# Patient Record
Sex: Female | Born: 1965 | Race: White | Hispanic: No | Marital: Married | Smoking: Former smoker
Health system: Southern US, Community
[De-identification: ages and names within clinical notes are randomized; demographics above are authoritative.]

## PROBLEM LIST (undated history)

## (undated) DIAGNOSIS — R0602 Shortness of breath: Secondary | ICD-10-CM

## (undated) DIAGNOSIS — I10 Essential (primary) hypertension: Secondary | ICD-10-CM

## (undated) DIAGNOSIS — F419 Anxiety disorder, unspecified: Secondary | ICD-10-CM

## (undated) DIAGNOSIS — E7800 Pure hypercholesterolemia, unspecified: Secondary | ICD-10-CM

## (undated) DIAGNOSIS — F32A Depression, unspecified: Secondary | ICD-10-CM

## (undated) DIAGNOSIS — M2550 Pain in unspecified joint: Secondary | ICD-10-CM

## (undated) DIAGNOSIS — M7989 Other specified soft tissue disorders: Secondary | ICD-10-CM

## (undated) DIAGNOSIS — T7840XA Allergy, unspecified, initial encounter: Secondary | ICD-10-CM

## (undated) HISTORY — DX: Shortness of breath: R06.02

## (undated) HISTORY — DX: Other specified soft tissue disorders: M79.89

## (undated) HISTORY — DX: Anxiety disorder, unspecified: F41.9

## (undated) HISTORY — DX: Pain in unspecified joint: M25.50

## (undated) HISTORY — DX: Depression, unspecified: F32.A

## (undated) HISTORY — DX: Essential (primary) hypertension: I10

## (undated) HISTORY — DX: Allergy, unspecified, initial encounter: T78.40XA

## (undated) HISTORY — PX: TUBAL LIGATION: SHX77

## (undated) HISTORY — DX: Pure hypercholesterolemia, unspecified: E78.00

---

## 2002-04-16 ENCOUNTER — Encounter: Admission: RE | Admit: 2002-04-16 | Discharge: 2002-04-16 | Payer: Self-pay | Admitting: Family Medicine

## 2002-04-16 ENCOUNTER — Encounter: Payer: Self-pay | Admitting: Family Medicine

## 2006-04-17 ENCOUNTER — Other Ambulatory Visit: Admission: RE | Admit: 2006-04-17 | Discharge: 2006-04-17 | Payer: Self-pay | Admitting: Family Medicine

## 2006-05-03 ENCOUNTER — Encounter: Admission: RE | Admit: 2006-05-03 | Discharge: 2006-05-03 | Payer: Self-pay | Admitting: Family Medicine

## 2006-05-22 ENCOUNTER — Encounter: Admission: RE | Admit: 2006-05-22 | Discharge: 2006-05-22 | Payer: Self-pay | Admitting: Family Medicine

## 2006-10-02 ENCOUNTER — Encounter: Admission: RE | Admit: 2006-10-02 | Discharge: 2006-10-02 | Payer: Self-pay | Admitting: Family Medicine

## 2007-12-15 ENCOUNTER — Other Ambulatory Visit: Admission: RE | Admit: 2007-12-15 | Discharge: 2007-12-15 | Payer: Self-pay | Admitting: Family Medicine

## 2010-05-23 ENCOUNTER — Emergency Department (HOSPITAL_COMMUNITY): Admission: EM | Admit: 2010-05-23 | Discharge: 2010-05-23 | Payer: Self-pay | Admitting: Emergency Medicine

## 2010-11-02 LAB — CBC
HCT: 42.9 % (ref 36.0–46.0)
Hemoglobin: 14.8 g/dL (ref 12.0–15.0)
MCH: 30.5 pg (ref 26.0–34.0)
MCHC: 34.5 g/dL (ref 30.0–36.0)
MCV: 88.3 fL (ref 78.0–100.0)
Platelets: 343 10*3/uL (ref 150–400)
RBC: 4.86 MIL/uL (ref 3.87–5.11)
RDW: 12.2 % (ref 11.5–15.5)
WBC: 7.6 10*3/uL (ref 4.0–10.5)

## 2010-11-02 LAB — URINE MICROSCOPIC-ADD ON

## 2010-11-02 LAB — URINALYSIS, ROUTINE W REFLEX MICROSCOPIC
Bilirubin Urine: NEGATIVE
Ketones, ur: NEGATIVE mg/dL
Nitrite: NEGATIVE
Urobilinogen, UA: 0.2 mg/dL (ref 0.0–1.0)

## 2010-11-02 LAB — COMPREHENSIVE METABOLIC PANEL
ALT: 15 U/L (ref 0–35)
AST: 16 U/L (ref 0–37)
Albumin: 4.3 g/dL (ref 3.5–5.2)
Alkaline Phosphatase: 35 U/L — ABNORMAL LOW (ref 39–117)
BUN: 17 mg/dL (ref 6–23)
CO2: 24 mEq/L (ref 19–32)
Calcium: 9.3 mg/dL (ref 8.4–10.5)
Chloride: 103 mEq/L (ref 96–112)
Creatinine, Ser: 0.77 mg/dL (ref 0.4–1.2)
GFR calc Af Amer: >60 mL/min (ref >60)
GFR calc non Af Amer: >60 mL/min (ref >60)
Glucose, Bld: 100 mg/dL — ABNORMAL HIGH (ref 70–99)
Potassium: 3.7 mEq/L (ref 3.5–5.1)
Sodium: 136 mEq/L (ref 135–145)
Total Bilirubin: 0.6 mg/dL (ref 0.3–1.2)
Total Protein: 7.5 g/dL (ref 6.0–8.3)

## 2010-11-02 LAB — DIFFERENTIAL
Basophils Absolute: 0 10*3/uL (ref 0.0–0.1)
Eosinophils Absolute: 0.3 10*3/uL (ref 0.0–0.7)
Eosinophils Relative: 3 % (ref 0–5)

## 2010-11-02 LAB — PREGNANCY, URINE: Preg Test, Ur: NEGATIVE

## 2010-11-02 LAB — LIPASE, BLOOD: Lipase: 24 U/L (ref 11–59)

## 2011-01-24 ENCOUNTER — Other Ambulatory Visit (HOSPITAL_COMMUNITY)
Admission: RE | Admit: 2011-01-24 | Discharge: 2011-01-24 | Disposition: A | Discharge status: Home / Self Care | Payer: Federal, State, Local not specified - PPO | Source: Ambulatory Visit | Attending: Family Medicine | Admitting: Family Medicine

## 2011-01-24 ENCOUNTER — Other Ambulatory Visit: Payer: Self-pay | Admitting: Family Medicine

## 2011-01-24 DIAGNOSIS — Z124 Encounter for screening for malignant neoplasm of cervix: Secondary | ICD-10-CM | POA: Insufficient documentation

## 2013-05-01 ENCOUNTER — Other Ambulatory Visit: Payer: Self-pay | Admitting: Family Medicine

## 2013-05-01 DIAGNOSIS — N63 Unspecified lump in unspecified breast: Secondary | ICD-10-CM

## 2013-05-12 ENCOUNTER — Ambulatory Visit
Admission: RE | Admit: 2013-05-12 | Discharge: 2013-05-12 | Disposition: A | Discharge status: Home / Self Care | Payer: Federal, State, Local not specified - PPO | Source: Ambulatory Visit | Attending: Family Medicine | Admitting: Family Medicine

## 2013-05-12 ENCOUNTER — Other Ambulatory Visit: Payer: Self-pay | Admitting: Family Medicine

## 2013-05-12 DIAGNOSIS — N63 Unspecified lump in unspecified breast: Secondary | ICD-10-CM

## 2014-03-24 ENCOUNTER — Ambulatory Visit (INDEPENDENT_AMBULATORY_CARE_PROVIDER_SITE_OTHER): Payer: Federal, State, Local not specified - PPO | Admitting: Family Medicine

## 2014-03-24 VITALS — BP 126/74 | HR 84 | Temp 98.8°F | Resp 17 | Ht 62.0 in | Wt 172.0 lb

## 2014-03-24 DIAGNOSIS — R059 Cough, unspecified: Secondary | ICD-10-CM

## 2014-03-24 DIAGNOSIS — J0100 Acute maxillary sinusitis, unspecified: Secondary | ICD-10-CM

## 2014-03-24 DIAGNOSIS — R05 Cough: Secondary | ICD-10-CM

## 2014-03-24 MED ORDER — AMOXICILLIN 875 MG PO TABS: 875.0000 mg | ORAL_TABLET | Freq: Two times a day (BID) | ORAL | Status: DC

## 2014-03-24 MED ORDER — HYDROCODONE-HOMATROPINE 5-1.5 MG/5ML PO SYRP: 5.0000 mL | ORAL_SOLUTION | ORAL | Status: DC | PRN

## 2014-03-24 MED ORDER — FLUTICASONE PROPIONATE 50 MCG/ACT NA SUSP: NASAL | Status: DC

## 2014-03-24 NOTE — Progress Notes (Signed)
Subjective: Patient has been having problems with respiratory congestion for 3 or 4 days. She says her grandson had been sick 10 days ago. She had fever initially. She has pain in her sinuses and pressure there. Her nose is congested. Throat has been sore. Ears do not hurt. She is coughing a lot, especially when she lays down. She has been able to keep working.  Objective: Pleasant alert lady in no major distress but she is obviously congested. Her TMs are normal. Throat was clear. Tender over her sinuses, especially the left maxillary and frontal. Neck supple without significant nodes. Chest clear. Heart regular without murmurs.  Assessment: URI with sinusitis and cough  Plan: Fluticasone nose spray Hycodan cough syrup Amoxicillin 875 twice daily Return if problems

## 2014-03-24 NOTE — Patient Instructions (Signed)
Drink plenty of fluids  Use the nose spray 2 sprays each nostril twice daily for 3 or 4 days then decrease to once daily  Take the amoxicillin one twice daily  Use the cough syrup 1 teaspoon every 4-6 hours as needed. It will tend to make you drowsy so primarily use it at nighttime  Return if worse

## 2014-10-22 ENCOUNTER — Other Ambulatory Visit (HOSPITAL_COMMUNITY)
Admission: RE | Admit: 2014-10-22 | Discharge: 2014-10-22 | Disposition: A | Discharge status: Home / Self Care | Payer: Federal, State, Local not specified - PPO | Source: Ambulatory Visit | Attending: Family Medicine | Admitting: Family Medicine

## 2014-10-22 ENCOUNTER — Other Ambulatory Visit: Payer: Self-pay | Admitting: Family Medicine

## 2014-10-22 DIAGNOSIS — Z124 Encounter for screening for malignant neoplasm of cervix: Secondary | ICD-10-CM | POA: Diagnosis not present

## 2014-10-26 LAB — CYTOLOGY - PAP

## 2016-01-09 ENCOUNTER — Other Ambulatory Visit: Payer: Self-pay

## 2016-01-09 ENCOUNTER — Other Ambulatory Visit (HOSPITAL_BASED_OUTPATIENT_CLINIC_OR_DEPARTMENT_OTHER): Payer: Self-pay

## 2016-01-09 DIAGNOSIS — F39 Unspecified mood [affective] disorder: Secondary | ICD-10-CM | POA: Diagnosis not present

## 2016-01-09 DIAGNOSIS — Z0000 Encounter for general adult medical examination without abnormal findings: Secondary | ICD-10-CM | POA: Diagnosis not present

## 2016-01-09 DIAGNOSIS — M545 Low back pain: Secondary | ICD-10-CM | POA: Diagnosis not present

## 2016-01-09 DIAGNOSIS — Z1231 Encounter for screening mammogram for malignant neoplasm of breast: Secondary | ICD-10-CM

## 2016-01-09 DIAGNOSIS — E7800 Pure hypercholesterolemia, unspecified: Secondary | ICD-10-CM | POA: Diagnosis not present

## 2016-01-24 ENCOUNTER — Other Ambulatory Visit: Payer: Self-pay | Admitting: Family Medicine

## 2016-01-24 ENCOUNTER — Ambulatory Visit
Admission: RE | Admit: 2016-01-24 | Discharge: 2016-01-24 | Disposition: A | Discharge status: Home / Self Care | Payer: Federal, State, Local not specified - PPO | Source: Ambulatory Visit

## 2016-01-24 DIAGNOSIS — Z1231 Encounter for screening mammogram for malignant neoplasm of breast: Secondary | ICD-10-CM

## 2016-03-05 ENCOUNTER — Other Ambulatory Visit: Payer: Self-pay | Admitting: Gastroenterology

## 2016-03-05 DIAGNOSIS — D122 Benign neoplasm of ascending colon: Secondary | ICD-10-CM | POA: Diagnosis not present

## 2016-03-05 DIAGNOSIS — K5730 Diverticulosis of large intestine without perforation or abscess without bleeding: Secondary | ICD-10-CM | POA: Diagnosis not present

## 2016-03-05 DIAGNOSIS — Z1211 Encounter for screening for malignant neoplasm of colon: Secondary | ICD-10-CM | POA: Diagnosis not present

## 2016-03-05 DIAGNOSIS — D126 Benign neoplasm of colon, unspecified: Secondary | ICD-10-CM | POA: Diagnosis not present

## 2016-09-04 DIAGNOSIS — R635 Abnormal weight gain: Secondary | ICD-10-CM | POA: Diagnosis not present

## 2016-09-04 DIAGNOSIS — M7710 Lateral epicondylitis, unspecified elbow: Secondary | ICD-10-CM | POA: Diagnosis not present

## 2016-09-14 ENCOUNTER — Encounter (INDEPENDENT_AMBULATORY_CARE_PROVIDER_SITE_OTHER): Payer: Self-pay | Admitting: Orthopedic Surgery

## 2016-09-14 ENCOUNTER — Ambulatory Visit (INDEPENDENT_AMBULATORY_CARE_PROVIDER_SITE_OTHER): Payer: Federal, State, Local not specified - PPO | Admitting: Orthopedic Surgery

## 2016-09-14 ENCOUNTER — Ambulatory Visit (INDEPENDENT_AMBULATORY_CARE_PROVIDER_SITE_OTHER): Payer: Self-pay

## 2016-09-14 DIAGNOSIS — M7711 Lateral epicondylitis, right elbow: Secondary | ICD-10-CM | POA: Diagnosis not present

## 2016-09-14 MED ORDER — DICLOFENAC SODIUM 2 % TD SOLN: 1.0000 | Freq: Two times a day (BID) | TRANSDERMAL | 1 refills | Status: DC

## 2016-09-14 NOTE — Progress Notes (Signed)
Office Visit Note   Patient: Lauren Murray           Date of Birth: 10/20/1965           MRN: [id number] Visit Date: 09/14/2016 Requested by: Maurice SmallElaine Griffin, MD [address] [place], [place] [address] PCP: No PCP Per Patient  Subjective: Chief Complaint  Patient presents with  . Right Elbow - Pain    HPI Lauren Murray is a 51 year old right-hand-dominant patient with elbow pain.  Been going on for several months.  Gripping and picking up things is painful.  She reports some burning at the olecranon and lateral condyle region.  Does run down to her arm.  She tried Aleve with no relief.  Really no comfortable place and it aches all the time.  She has tried a tennis elbow strap.  She works at the [employer] but that does not involve any physical requirements.              Review of Systems All systems reviewed are negative as they relate to the chief complaint within the history of present illness.  Patient denies  fevers or chills.    Assessment & Plan: Visit Diagnoses:  1. Lateral epicondylitis, right elbow     Plan: Impression is right elbow pain localizing to the lateral epicondyle.  No real tenderness over the radial tunnel however she is having symptoms which are constant in nature.  I like to try her with some 10 said.  Also want to get an EMG nerve study to make sure this is not radial tunnel syndrome.  I'll see her back after that study.  We talked about the utility of injections and therapy in this particular clinical setting and, natural history of tennis elbow is typical resolution within a year's time without any intervention.  I'll see her back after her nerve study.  Follow-Up Instructions: No Follow-up on file.   Orders:  Orders Placed This Encounter  Procedures  . XR Elbow 2 Views Right   No orders of the defined types were placed in this encounter.     Procedures: No procedures performed   Clinical Data: No additional findings.  Objective: Vital  Signs: There were no vitals taken for this visit.  Physical Exam   Constitutional: Patient appears well-developed HEENT:  Head: Normocephalic Eyes:EOM are normal Neck: Normal range of motion Cardiovascular: Normal rate Pulmonary/chest: Effort normal Neurologic: Patient is alert Skin: Skin is warm Psychiatric: Patient has normal mood and affect    Ortho Exam examination the right arm demonstrates good cervical spine range of motion 5 out of 5 grip EPL FPL interosseous wrist flexion and wrist extension biceps triceps and deltoid strength.  She has palpable radial pulse.  She does have some pain with gripping pain with resisted supination on the right not the left.  Does have tenderness at the lateral epicondyles with resisted finger extension and resisted wrist extension.  Elbow range of motion is full.  No subluxation of the ulnar nerve in the cubital tunnel.  No other masses lymph and other skin changes noted in the right elbow region  Specialty Comments:  No specialty comments available.  Imaging: Xr Elbow 2 Views Right  Result Date: 09/14/2016 2 views right elbow reviewed AP and lateral.  Joint is reduced.  No arthritis or spurring is present.  No ossicles off the medial or lateral condyle.  Radial head normal    PMFS History: Patient Active Problem List  Diagnosis Date Noted  . Lateral epicondylitis, right elbow 09/14/2016   Past Medical History:  Diagnosis Date  . Allergy     Family History  Problem Relation Age of Onset  . Cancer Mother   . Cancer Paternal Grandmother     Past Surgical History:  Procedure Laterality Date  . TUBAL LIGATION     Social History   Occupational History  . Not on file.   Social History Main Topics  . Smoking status: Never Smoker  . Smokeless tobacco: Never Used  . Alcohol use No  . Drug use: No  . Sexual activity: No

## 2016-09-28 ENCOUNTER — Ambulatory Visit (INDEPENDENT_AMBULATORY_CARE_PROVIDER_SITE_OTHER): Payer: Federal, State, Local not specified - PPO | Admitting: Physical Medicine and Rehabilitation

## 2016-09-28 ENCOUNTER — Encounter (INDEPENDENT_AMBULATORY_CARE_PROVIDER_SITE_OTHER): Payer: Self-pay | Admitting: Physical Medicine and Rehabilitation

## 2016-09-28 DIAGNOSIS — M79601 Pain in right arm: Secondary | ICD-10-CM | POA: Diagnosis not present

## 2016-09-28 DIAGNOSIS — R29898 Other symptoms and signs involving the musculoskeletal system: Secondary | ICD-10-CM

## 2016-09-28 NOTE — Progress Notes (Signed)
Lauren Murray - 51 y.o. female MRN [id number]  Date of birth: March 12, 1966  Office Visit Note: Visit Date: 09/28/2016 PCP: No PCP Per Patient Referred by: Cammy Copa, MD  Subjective: Chief Complaint  Patient presents with  . Right Arm - Weakness   HPI: Lauren Murray is a 51 year old right-hand dominant female with chronic burning right elbow pain. She gets a lot of pain with trying to grip and has difficulty gripping. She denies any paresthesias numbness or tingling. She's had the symptoms for at least a year and feels like they're getting worse over the last few months. She also gets some aching when trying to get to sleep. She has had bracing and conservative care and injection by Dr. August Saucer who is been following her quite closely. He is questioning specifically radial tunnel syndrome.   ROS Otherwise per HPI.  Assessment & Plan: Visit Diagnoses:  1. Right arm pain   2. Weakness of hand     Plan: Findings:  Impression: Essentially NORMAL electrodiagnostic study of the right upper limb.  There is no significant electrodiagnostic evidence of nerve entrapment (specifically Radial Tunnel), brachial plexopathy or cervical radiculopathy.    As you know, purely sensory or demyelinating radiculopathies and chemical radiculitis may not be detected with this particular electrodiagnostic study.  Also, this test is more specific than sensitive for radial nerve entrapment at the radial tunnel. This does not rule out a radial tunnel syndrome.  Recommendations: 1.  Follow-up with referring physician. 2.  Continue current management of symptoms.     Meds & Orders: No orders of the defined types were placed in this encounter.   Orders Placed This Encounter  Procedures  . NCV with EMG (electromyography)    Follow-up: Return for Scheduled follow-up with Dr. August Saucer.   Procedures: No procedures performed  EMG & NCV Findings: All nerve conduction studies (as indicated in the following  tables) were within normal limits.    All examined muscles (as indicated in the following table) showed no evidence of electrical instability.    Impression: Essentially NORMAL electrodiagnostic study of the right upper limb.  There is no significant electrodiagnostic evidence of nerve entrapment (specifically Radial Tunnel), brachial plexopathy or cervical radiculopathy.    As you know, purely sensory or demyelinating radiculopathies and chemical radiculitis may not be detected with this particular electrodiagnostic study.  Also, this test is more specific than sensitive for radial nerve entrapment at the radial tunnel. This does not rule out a radial tunnel syndrome.  Recommendations: 1.  Follow-up with referring physician. 2.  Continue current management of symptoms.    Nerve Conduction Studies Anti Sensory Summary Table   Stim Site NR Peak (ms) Norm Peak (ms) P-T Amp (V) Norm P-T Amp Site1 Site2 Delta-P (ms) Dist (cm) Vel (m/s) Norm Vel (m/s)  Right Median Acr Palm Anti Sensory (2nd Digit)  32C  Wrist    3.0 <3.6 10.0 >10 Wrist Palm 1.4 0.0    Palm    1.6 <2.0 31.6         Right Radial Anti Sensory (Base 1st Digit)  32.9C  Wrist    2.1 <3.1 16.5  Wrist Base 1st Digit 2.1 0.0    Right Ulnar Anti Sensory (5th Digit)  32.5C  Wrist    2.9 <3.7 20.3 >15.0 Wrist 5th Digit 2.9 14.0 48 >38   Motor Summary Table   Stim Site NR Onset (ms) Norm Onset (ms) O-P Amp (mV) Norm O-P Amp Site1 Site2 Delta-0 (  ms) Dist (cm) Vel (m/s) Norm Vel (m/s)  Right Median Motor (Abd Poll Brev)  33.1C  Wrist    2.9 <4.2 5.1 >5 Elbow Wrist 3.5 19.0 54 >50  Elbow    6.4  5.2         Right Radial Motor (Ext Indicis)  31.8C  8cm    1.6 <2.5 2.3 >1.7 Up Arm 8cm 3.6 22.0 61 >60  Up Arm    5.2  2.4         Right Ulnar Motor (Abd Dig Min)  32.9C  Wrist    2.9 <4.2 4.2 >3 B Elbow Wrist 2.5 18.0 72 >53  B Elbow    5.4  6.2  A Elbow B Elbow 1.2 9.5 79 >53  A Elbow    6.6  6.0          EMG   Side Muscle  Nerve Root Ins Act Fibs Psw Amp Dur Poly Recrt Int Dennie Bible Comment  Right 1stDorInt Ulnar C8-T1 Nml Nml Nml Nml Nml 0 Nml Nml   Right ExtIndicis Radial (Post Int) C7-8 Nml Nml Nml Nml Nml 0 Nml Nml   Right ExtDigCom   Nml Nml Nml Nml Nml 0 Nml Nml   Right BrachioRad Radial C5-6 Nml Nml Nml Nml Nml 0 Nml Nml   Right Triceps Radial C6-7-8 Nml Nml Nml Nml Nml 0 Nml Nml     Nerve Conduction Studies Anti Sensory Left/Right Comparison   Stim Site L Lat (ms) R Lat (ms) L-R Lat (ms) L Amp (V) R Amp (V) L-R Amp (%) Site1 Site2 L Vel (m/s) R Vel (m/s) L-R Vel (m/s)  Median Acr Palm Anti Sensory (2nd Digit)  32C  Wrist  3.0   10.0  Wrist Palm     Palm  1.6   31.6        Radial Anti Sensory (Base 1st Digit)  32.9C  Wrist  2.1   16.5  Wrist Base 1st Digit     Ulnar Anti Sensory (5th Digit)  32.5C  Wrist  2.9   20.3  Wrist 5th Digit  48    Motor Left/Right Comparison   Stim Site L Lat (ms) R Lat (ms) L-R Lat (ms) L Amp (mV) R Amp (mV) L-R Amp (%) Site1 Site2 L Vel (m/s) R Vel (m/s) L-R Vel (m/s)  Median Motor (Abd Poll Brev)  33.1C  Wrist  2.9   5.1  Elbow Wrist  54   Elbow  6.4   5.2        Radial Motor (Ext Indicis)  31.8C  8cm  1.6   2.3  Up Arm 8cm  61   Up Arm  5.2   2.4        Ulnar Motor (Abd Dig Min)  32.9C  Wrist  2.9   4.2  B Elbow Wrist  72   B Elbow  5.4   6.2  A Elbow B Elbow  79   A Elbow  6.6   6.0           Clinical History: No specialty comments available.  She reports that she has never smoked. She has never used smokeless tobacco. No results for input(s): HGBA1C, LABURIC in the last 8760 hours.  Objective:  VS:  HT:    WT:   BMI:     BP:   HR: bpm  TEMP: ( )  RESP:  Physical Exam  Musculoskeletal:  Examination of the right elbow shows tenderness and  pain over the extensor wad. She does have some pain with resisted pronation.Inspection reveals no atrophy of the bilateral APB or FDI or hand intrinsics. There is no swelling, color changes, allodynia or dystrophic  changes. There is 5 out of 5 strength in the bilateral wrist extension, finger abduction and long finger flexion. There is intact sensation to light touch in all dermatomal and peripheral nerve distributions.     Ortho Exam Imaging: No results found.  Past Medical/Family/Surgical/Social History: Medications & Allergies reviewed per EMR Patient Active Problem List   Diagnosis Date Noted  . Lateral epicondylitis, right elbow 09/14/2016   Past Medical History:  Diagnosis Date  . Allergy    Family History  Problem Relation Age of Onset  . Cancer Mother   . Cancer Paternal Grandmother    Past Surgical History:  Procedure Laterality Date  . TUBAL LIGATION     Social History   Occupational History  . Not on file.   Social History Main Topics  . Smoking status: Never Smoker  . Smokeless tobacco: Never Used  . Alcohol use No  . Drug use: No  . Sexual activity: No

## 2016-10-01 NOTE — Procedures (Signed)
EMG & NCV Findings: All nerve conduction studies (as indicated in the following tables) were within normal limits.    All examined muscles (as indicated in the following table) showed no evidence of electrical instability.    Impression: Essentially NORMAL electrodiagnostic study of the right upper limb.  There is no significant electrodiagnostic evidence of nerve entrapment (specifically Radial Tunnel), brachial plexopathy or cervical radiculopathy.    As you know, purely sensory or demyelinating radiculopathies and chemical radiculitis may not be detected with this particular electrodiagnostic study.  Also, this test is more specific than sensitive for radial nerve entrapment at the radial tunnel. This does not rule out a radial tunnel syndrome.  Recommendations: 1.  Follow-up with referring physician. 2.  Continue current management of symptoms.    Nerve Conduction Studies Anti Sensory Summary Table   Stim Site NR Peak (ms) Norm Peak (ms) P-T Amp (V) Norm P-T Amp Site1 Site2 Delta-P (ms) Dist (cm) Vel (m/s) Norm Vel (m/s)  Right Median Acr Palm Anti Sensory (2nd Digit)  32C  Wrist    3.0 <3.6 10.0 >10 Wrist Palm 1.4 0.0    Palm    1.6 <2.0 31.6         Right Radial Anti Sensory (Base 1st Digit)  32.9C  Wrist    2.1 <3.1 16.5  Wrist Base 1st Digit 2.1 0.0    Right Ulnar Anti Sensory (5th Digit)  32.5C  Wrist    2.9 <3.7 20.3 >15.0 Wrist 5th Digit 2.9 14.0 48 >38   Motor Summary Table   Stim Site NR Onset (ms) Norm Onset (ms) O-P Amp (mV) Norm O-P Amp Site1 Site2 Delta-0 (ms) Dist (cm) Vel (m/s) Norm Vel (m/s)  Right Median Motor (Abd Poll Brev)  33.1C  Wrist    2.9 <4.2 5.1 >5 Elbow Wrist 3.5 19.0 54 >50  Elbow    6.4  5.2         Right Radial Motor (Ext Indicis)  31.8C  8cm    1.6 <2.5 2.3 >1.7 Up Arm 8cm 3.6 22.0 61 >60  Up Arm    5.2  2.4         Right Ulnar Motor (Abd Dig Min)  32.9C  Wrist    2.9 <4.2 4.2 >3 B Elbow Wrist 2.5 18.0 72 >53  B Elbow    5.4  6.2  A  Elbow B Elbow 1.2 9.5 79 >53  A Elbow    6.6  6.0          EMG   Side Muscle Nerve Root Ins Act Fibs Psw Amp Dur Poly Recrt Int Dennie BiblePat Comment  Right 1stDorInt Ulnar C8-T1 Nml Nml Nml Nml Nml 0 Nml Nml   Right ExtIndicis Radial (Post Int) C7-8 Nml Nml Nml Nml Nml 0 Nml Nml   Right ExtDigCom   Nml Nml Nml Nml Nml 0 Nml Nml   Right BrachioRad Radial C5-6 Nml Nml Nml Nml Nml 0 Nml Nml   Right Triceps Radial C6-7-8 Nml Nml Nml Nml Nml 0 Nml Nml     Nerve Conduction Studies Anti Sensory Left/Right Comparison   Stim Site L Lat (ms) R Lat (ms) L-R Lat (ms) L Amp (V) R Amp (V) L-R Amp (%) Site1 Site2 L Vel (m/s) R Vel (m/s) L-R Vel (m/s)  Median Acr Palm Anti Sensory (2nd Digit)  32C  Wrist  3.0   10.0  Wrist Palm     Palm  1.6   31.6  Radial Anti Sensory (Base 1st Digit)  32.9C  Wrist  2.1   16.5  Wrist Base 1st Digit     Ulnar Anti Sensory (5th Digit)  32.5C  Wrist  2.9   20.3  Wrist 5th Digit  48    Motor Left/Right Comparison   Stim Site L Lat (ms) R Lat (ms) L-R Lat (ms) L Amp (mV) R Amp (mV) L-R Amp (%) Site1 Site2 L Vel (m/s) R Vel (m/s) L-R Vel (m/s)  Median Motor (Abd Poll Brev)  33.1C  Wrist  2.9   5.1  Elbow Wrist  54   Elbow  6.4   5.2        Radial Motor (Ext Indicis)  31.8C  8cm  1.6   2.3  Up Arm 8cm  61   Up Arm  5.2   2.4        Ulnar Motor (Abd Dig Min)  32.9C  Wrist  2.9   4.2  B Elbow Wrist  72   B Elbow  5.4   6.2  A Elbow B Elbow  79   A Elbow  6.6   6.0

## 2016-10-11 ENCOUNTER — Ambulatory Visit (INDEPENDENT_AMBULATORY_CARE_PROVIDER_SITE_OTHER): Payer: Federal, State, Local not specified - PPO | Admitting: Orthopedic Surgery

## 2016-10-17 ENCOUNTER — Encounter (INDEPENDENT_AMBULATORY_CARE_PROVIDER_SITE_OTHER): Payer: Self-pay

## 2016-10-17 ENCOUNTER — Other Ambulatory Visit: Payer: Self-pay | Admitting: Physician Assistant

## 2016-10-17 ENCOUNTER — Ambulatory Visit (INDEPENDENT_AMBULATORY_CARE_PROVIDER_SITE_OTHER): Payer: Federal, State, Local not specified - PPO | Admitting: Orthopedic Surgery

## 2016-10-17 ENCOUNTER — Encounter (INDEPENDENT_AMBULATORY_CARE_PROVIDER_SITE_OTHER): Payer: Self-pay | Admitting: Orthopedic Surgery

## 2016-10-17 DIAGNOSIS — M79601 Pain in right arm: Secondary | ICD-10-CM | POA: Insufficient documentation

## 2016-10-17 DIAGNOSIS — N938 Other specified abnormal uterine and vaginal bleeding: Secondary | ICD-10-CM | POA: Diagnosis not present

## 2016-10-17 NOTE — Progress Notes (Signed)
Office Visit Note   Patient: Lauren Murray           Date of Birth: May 24, 1966           MRN: [id number] Visit Date: 10/17/2016 Requested by: No referring provider defined for this encounter. PCP: No PCP Per Patient  Subjective: Chief Complaint  Patient presents with  . Right Arm - Pain    HPI patient is a 51 year old patient with right elbow pain.  Been fairly incapacitating and bothering her for over a year.  His been worse over the past 3-4 months.  Denies any neck pain.  Has pain on the lateral elbow as well as the triceps region.  She does computer work.  She tried topicals heat and cold naproxen.  Tried Aleve.  Can't really get any comfortable position.  It wakes her from sleep.  Nerve study was normal.              Review of Systems All systems reviewed are negative as they relate to the chief complaint within the history of present illness.  Patient denies  fevers or chills. I think that I think it didn't   Assessment & Plan: Visit Diagnoses:  1. Right arm pain     Plan: Impression is right arm pain.  She does have some tenderness with resisted wrist extension and finger extension localizing to the lateral epicondyle but she also has triceps pain with resisted elbow extension.  I looked at her lateral condyle with the ultrasound and mild tendinosis is present.  No effusion in the joint.  She needs an MRI scan of that elbow 2 try to more fully evaluate this clinical picture.  It somewhat enigmatic at this time particular with the pain wakes her from sleep.  Follow-Up Instructions: No Follow-up on file.   Orders:  No orders of the defined types were placed in this encounter.  No orders of the defined types were placed in this encounter.     Procedures: No procedures performed   Clinical Data: No additional findings.  Objective: Vital Signs: There were no vitals taken for this visit.  Physical Exam   Constitutional: Patient appears well-developed HEENT:    Head: Normocephalic Eyes:EOM are normal Neck: Normal range of motion Cardiovascular: Normal rate Pulmonary/chest: Effort normal Neurologic: Patient is alert Skin: Skin is warm Psychiatric: Patient has normal mood and affect    Ortho Exam right elbow exam demonstrates full flexion and extension pronation supination.  No tenderness medially.  No subluxation of the ulnar nerve medially.  Neck range of motion is full.  Motor sensory function to the hand is intact radial pulses intact.  Does have tenderness localizing to the lateral condyle with resisted finger extension and wrist extension.  Also tenderness over the triceps tendon with resisted elbow extension.  Not much in the way of course grinding or crepitus with active or passive range of motion of the elbow joint.  No other masses lymph adenopathy or skin changes noted in the elbow joint today.  No tenderness in the radial tunnel.  Specialty Comments:  No specialty comments available.  Imaging: No results found.   PMFS History: Patient Active Problem List   Diagnosis Date Noted  . Right arm pain 10/17/2016  . Lateral epicondylitis, right elbow 09/14/2016   Past Medical History:  Diagnosis Date  . Allergy     Family History  Problem Relation Age of Onset  . Cancer Mother   . Cancer Paternal Grandmother  Past Surgical History:  Procedure Laterality Date  . TUBAL LIGATION     Social History   Occupational History  . Not on file.   Social History Main Topics  . Smoking status: Never Smoker  . Smokeless tobacco: Never Used  . Alcohol use No  . Drug use: No  . Sexual activity: No

## 2016-10-18 ENCOUNTER — Ambulatory Visit
Admission: RE | Admit: 2016-10-18 | Discharge: 2016-10-18 | Disposition: A | Discharge status: Home / Self Care | Payer: Federal, State, Local not specified - PPO | Source: Ambulatory Visit | Attending: Physician Assistant | Admitting: Physician Assistant

## 2016-10-18 DIAGNOSIS — N938 Other specified abnormal uterine and vaginal bleeding: Secondary | ICD-10-CM

## 2016-10-18 DIAGNOSIS — D259 Leiomyoma of uterus, unspecified: Secondary | ICD-10-CM | POA: Diagnosis not present

## 2016-10-19 ENCOUNTER — Other Ambulatory Visit: Payer: Self-pay | Admitting: Physician Assistant

## 2016-10-19 DIAGNOSIS — N938 Other specified abnormal uterine and vaginal bleeding: Secondary | ICD-10-CM

## 2016-10-28 ENCOUNTER — Ambulatory Visit
Admission: RE | Admit: 2016-10-28 | Discharge: 2016-10-28 | Disposition: A | Discharge status: Home / Self Care | Payer: Federal, State, Local not specified - PPO | Source: Ambulatory Visit | Attending: Orthopedic Surgery | Admitting: Orthopedic Surgery

## 2016-10-28 DIAGNOSIS — M79601 Pain in right arm: Secondary | ICD-10-CM

## 2016-10-28 DIAGNOSIS — S51011A Laceration without foreign body of right elbow, initial encounter: Secondary | ICD-10-CM | POA: Diagnosis not present

## 2016-11-07 ENCOUNTER — Ambulatory Visit (INDEPENDENT_AMBULATORY_CARE_PROVIDER_SITE_OTHER): Payer: Federal, State, Local not specified - PPO | Admitting: Orthopedic Surgery

## 2016-11-14 ENCOUNTER — Encounter (INDEPENDENT_AMBULATORY_CARE_PROVIDER_SITE_OTHER): Payer: Self-pay | Admitting: Orthopedic Surgery

## 2016-11-14 ENCOUNTER — Ambulatory Visit (INDEPENDENT_AMBULATORY_CARE_PROVIDER_SITE_OTHER): Payer: Federal, State, Local not specified - PPO | Admitting: Orthopedic Surgery

## 2016-11-14 DIAGNOSIS — M7711 Lateral epicondylitis, right elbow: Secondary | ICD-10-CM

## 2016-11-14 MED ORDER — NITROGLYCERIN 0.2 MG/HR TD PT24: MEDICATED_PATCH | TRANSDERMAL | 0 refills | Status: DC

## 2016-11-14 MED ORDER — DICLOFENAC SODIUM 2 % TD SOLN: 2.0000 | Freq: Two times a day (BID) | TRANSDERMAL | 1 refills | Status: DC

## 2016-11-14 NOTE — Progress Notes (Signed)
Office Visit Note   Patient: Lauren Murray           Date of Birth: 1966/02/09           MRN: [id number] Visit Date: 11/14/2016 Requested by: No referring provider defined for this encounter. PCP: No PCP Per Patient  Subjective: Chief Complaint  Patient presents with  . Right Elbow - Pain    HPI: Babette Relic is a 51 year old patient with right elbow pain.  Here for review of MRI scan.  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  She still having the same symptoms.  She's tried a tennis elbow strap which helps "a little".  She does not have any neck pain but still has some radiation down the forearm.  She is working and will not really have a lot of time for therapy until early June.              ROS: All systems reviewed are negative as they relate to the chief complaint within the history of present illness.  Patient denies  fevers or chills.   Assessment & Plan: Visit Diagnoses: No diagnosis found.  Plan: Plan is for physical therapy nitroglycerin patch and initiation of topical anti-inflammatory.  Samples provided.  She's going to be able to do physical therapy in June.  I think that may help some.  This should be a self-limited process and I do not anticipate surgical intervention.  We talked about injection but the data on injection is that this will help but it will be short lived.  We will hold off on that intervention for now.  I'll see her back as needed  Follow-Up Instructions: Return if symptoms worsen or fail to improve.   Orders:  No orders of the defined types were placed in this encounter.  Meds ordered this encounter  Medications  . Diclofenac Sodium (PENNSAID) 2 % SOLN    Sig: Place 2 Squirts onto the skin 2 (two) times daily.    Dispense:  1 Bottle    Refill:  1  . nitroGLYCERIN (NITRO-DUR) 0.2 mg/hr patch    Sig: Apply 1/4 patch to right elbow once daily    Dispense:  30 patch    Refill:  0      Procedures: No  procedures performed   Clinical Data: No additional findings.  Objective: Vital Signs: LMP 10/28/2016   Physical Exam:   Constitutional: Patient appears well-developed HEENT:  Head: Normocephalic Eyes:EOM are normal Neck: Normal range of motion Cardiovascular: Normal rate Pulmonary/chest: Effort normal Neurologic: Patient is alert Skin: Skin is warm Psychiatric: Patient has normal mood and affect    Ortho Exam: Orthopedic exam demonstrates full range of motion of the right elbow.  5 out of 5 grip EPL FPL interosseous wrist flexion and wrist extension strength.  She does have some pain with gripping some pain with resisted wrist extension and finger extension.  All that pain localized to the lateral epicondyle.  Motor sensory function to the hand is intact radial pulses intact.  No tenderness on the medial epicondyle. Specialty Comments:  No specialty comments available.  Imaging: No results found.   PMFS History: Patient Active Problem List   Diagnosis Date Noted  . Right arm pain 10/17/2016  . Lateral epicondylitis, right elbow 09/14/2016   Past Medical History:  Diagnosis Date  . Allergy     Family History  Problem Relation Age of Onset  . Cancer Mother   .  Cancer Paternal Grandmother     Past Surgical History:  Procedure Laterality Date  . TUBAL LIGATION     Social History   Occupational History  . Not on file.   Social History Main Topics  . Smoking status: Never Smoker  . Smokeless tobacco: Never Used  . Alcohol use No  . Drug use: No  . Sexual activity: No

## 2016-12-10 ENCOUNTER — Other Ambulatory Visit: Payer: Federal, State, Local not specified - PPO

## 2017-03-05 ENCOUNTER — Other Ambulatory Visit: Payer: Self-pay | Admitting: Family Medicine

## 2017-03-05 DIAGNOSIS — N83202 Unspecified ovarian cyst, left side: Secondary | ICD-10-CM

## 2017-03-05 DIAGNOSIS — E7800 Pure hypercholesterolemia, unspecified: Secondary | ICD-10-CM | POA: Diagnosis not present

## 2017-03-05 DIAGNOSIS — Z23 Encounter for immunization: Secondary | ICD-10-CM | POA: Diagnosis not present

## 2017-03-05 DIAGNOSIS — Z0000 Encounter for general adult medical examination without abnormal findings: Secondary | ICD-10-CM | POA: Diagnosis not present

## 2017-03-12 ENCOUNTER — Ambulatory Visit
Admission: RE | Admit: 2017-03-12 | Discharge: 2017-03-12 | Disposition: A | Discharge status: Home / Self Care | Payer: Federal, State, Local not specified - PPO | Source: Ambulatory Visit | Attending: Family Medicine | Admitting: Family Medicine

## 2017-03-12 DIAGNOSIS — N83202 Unspecified ovarian cyst, left side: Secondary | ICD-10-CM

## 2017-03-12 DIAGNOSIS — D259 Leiomyoma of uterus, unspecified: Secondary | ICD-10-CM | POA: Diagnosis not present

## 2017-11-16 DIAGNOSIS — I10 Essential (primary) hypertension: Secondary | ICD-10-CM | POA: Diagnosis not present

## 2017-11-16 DIAGNOSIS — K0500 Acute gingivitis, plaque induced: Secondary | ICD-10-CM | POA: Diagnosis not present

## 2018-07-11 ENCOUNTER — Other Ambulatory Visit: Payer: Self-pay | Admitting: Family Medicine

## 2018-07-11 ENCOUNTER — Other Ambulatory Visit (HOSPITAL_COMMUNITY)
Admission: RE | Admit: 2018-07-11 | Discharge: 2018-07-11 | Disposition: A | Discharge status: Home / Self Care | Payer: Federal, State, Local not specified - PPO | Source: Ambulatory Visit | Attending: Family Medicine | Admitting: Family Medicine

## 2018-07-11 DIAGNOSIS — E7800 Pure hypercholesterolemia, unspecified: Secondary | ICD-10-CM | POA: Diagnosis not present

## 2018-07-11 DIAGNOSIS — I10 Essential (primary) hypertension: Secondary | ICD-10-CM | POA: Diagnosis not present

## 2018-07-11 DIAGNOSIS — Z124 Encounter for screening for malignant neoplasm of cervix: Secondary | ICD-10-CM | POA: Insufficient documentation

## 2018-07-11 DIAGNOSIS — N959 Unspecified menopausal and perimenopausal disorder: Secondary | ICD-10-CM | POA: Diagnosis not present

## 2018-07-11 DIAGNOSIS — N938 Other specified abnormal uterine and vaginal bleeding: Secondary | ICD-10-CM | POA: Diagnosis not present

## 2018-07-11 DIAGNOSIS — Z0000 Encounter for general adult medical examination without abnormal findings: Secondary | ICD-10-CM | POA: Diagnosis not present

## 2018-07-15 LAB — CYTOLOGY - PAP: Diagnosis: NEGATIVE

## 2018-07-25 DIAGNOSIS — N938 Other specified abnormal uterine and vaginal bleeding: Secondary | ICD-10-CM | POA: Diagnosis not present

## 2018-07-25 DIAGNOSIS — E7800 Pure hypercholesterolemia, unspecified: Secondary | ICD-10-CM | POA: Diagnosis not present

## 2018-07-25 DIAGNOSIS — N959 Unspecified menopausal and perimenopausal disorder: Secondary | ICD-10-CM | POA: Diagnosis not present

## 2018-07-26 IMAGING — MR MR ELBOW*R* W/O CM
5 series · 40 of 40 positions shown · non-contrast
Comparison: None.

CLINICAL DATA: Right elbow pain posteriorly and laterally. Pain for
6 months.

EXAM:
MRI OF THE RIGHT ELBOW WITHOUT CONTRAST
TECHNIQUE: Multiplanar, multisequence MR imaging of the elbow was performed. No
intravenous contrast was administered.

[Series 3: T1 · axial · right · 3.0mm · 0.44mm/px · z∈[-67,+5]mm · 9 of 23 slices shown]
[im 1/23]
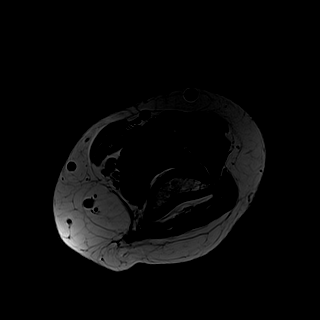
[im 3/23]
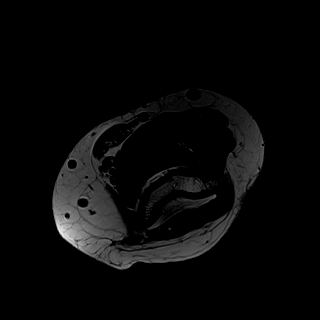
[im 6/23]
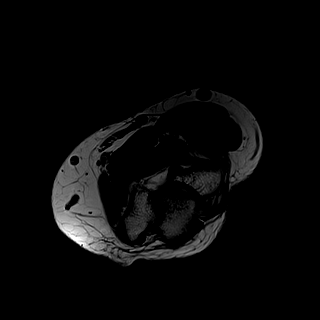
[im 9/23]
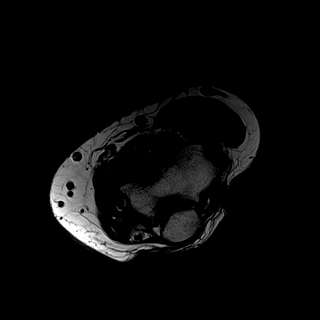
[im 12/23]
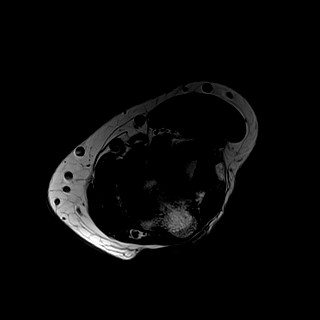
[im 14/23]
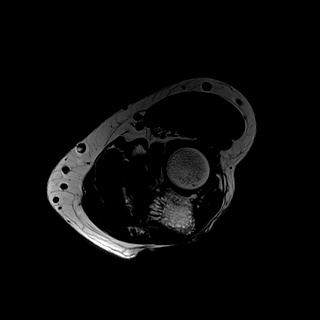
[im 17/23]
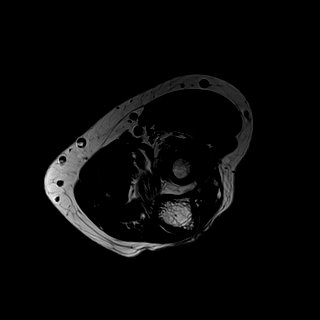
[im 20/23]
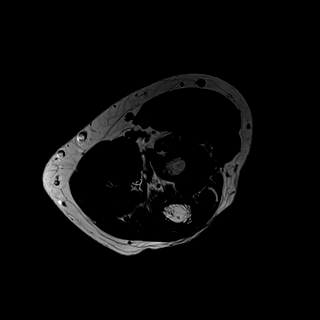
[im 23/23]
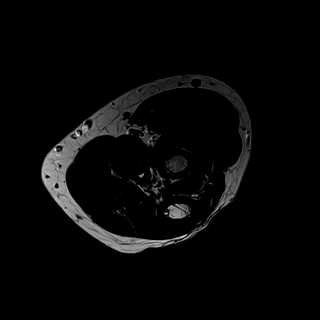

[Series 4: T2 fat-sat · axial · right · 3.0mm · 0.44mm/px · z∈[-67,+5]mm · 8 of 23 slices shown (1 of 2)]
[im 1/23]
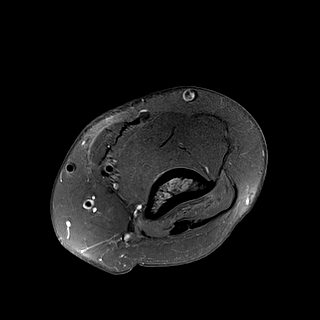
[im 4/23]
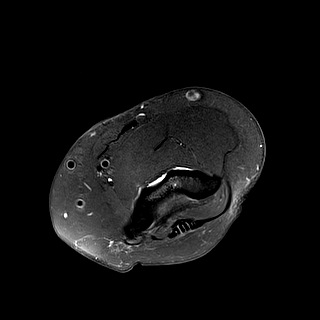
[im 7/23]
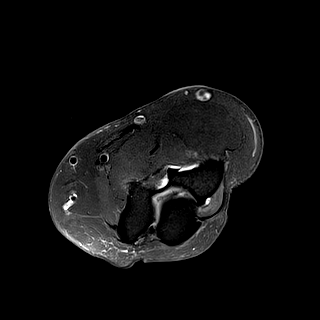
[im 10/23]
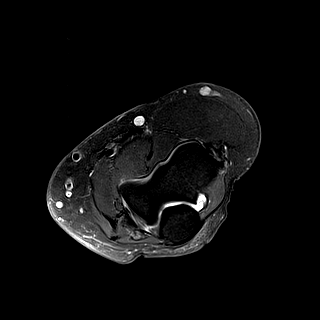
[im 13/23]
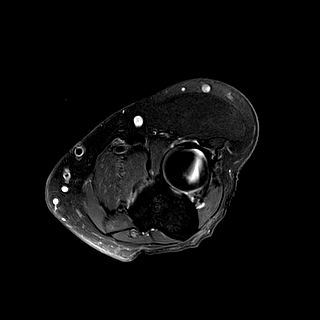
[im 16/23]
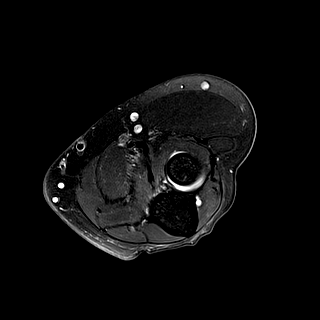
[im 19/23]
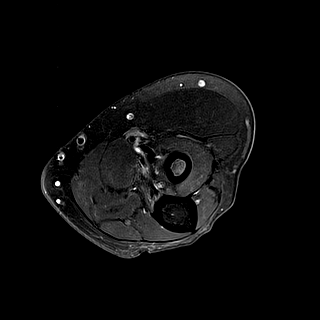
[im 23/23]
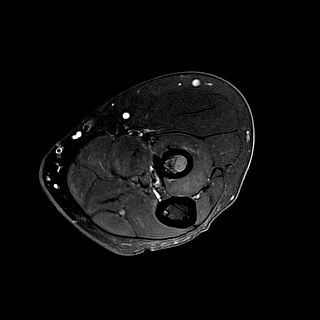

[Series 5: T2 fat-sat · coronal · right · 3.0mm · 0.44mm/px · 7 of 20 slices shown (2 of 2)]
[im 1/20]
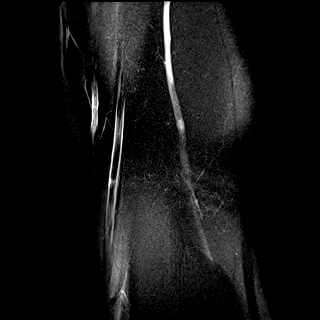
[im 4/20]
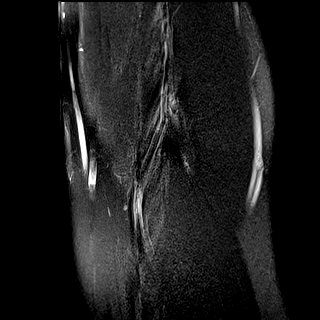
[im 7/20]
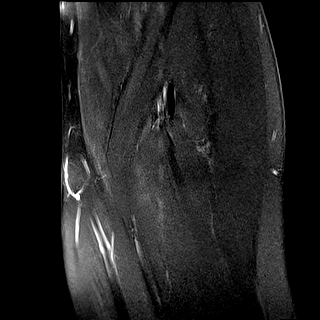
[im 10/20]
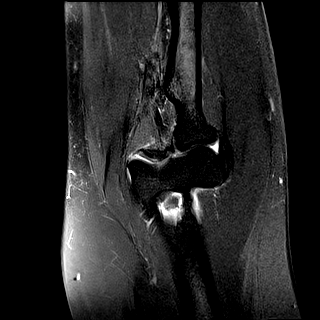
[im 13/20]
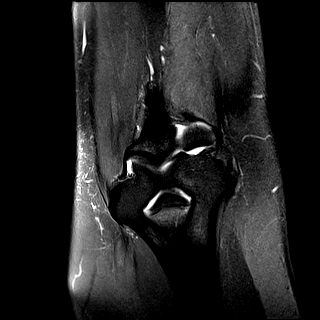
[im 16/20]
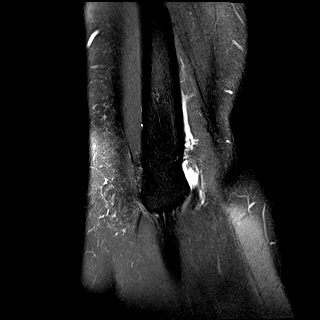
[im 20/20]
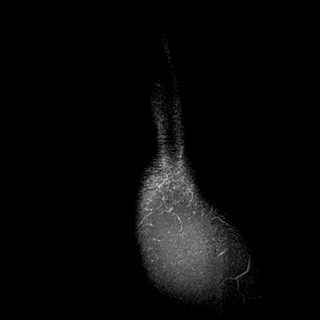

[Series 6: PD fat-sat · sagittal · right · 3.0mm · 0.36mm/px · 9 of 24 slices shown]
[im 1/24]
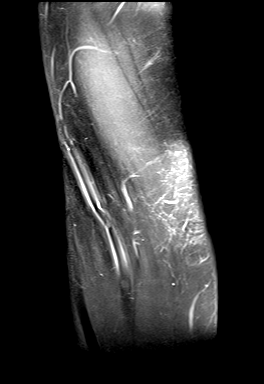
[im 3/24]
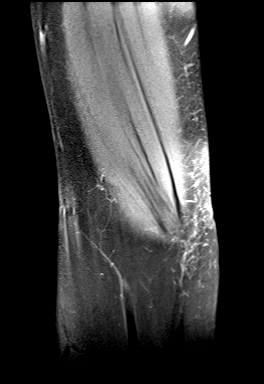
[im 6/24]
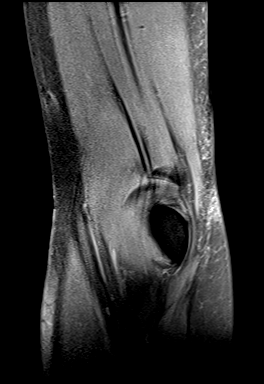
[im 9/24]
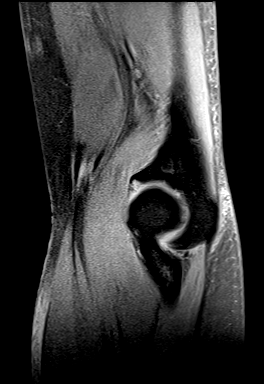
[im 12/24]
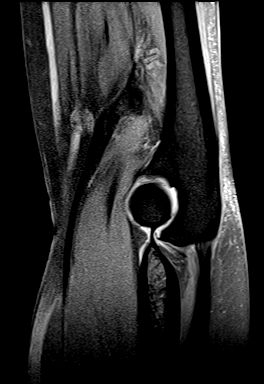
[im 15/24]
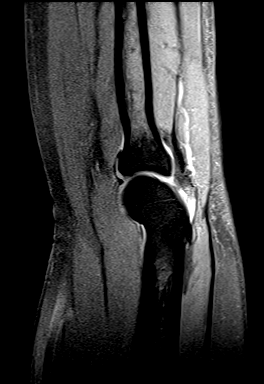
[im 18/24]
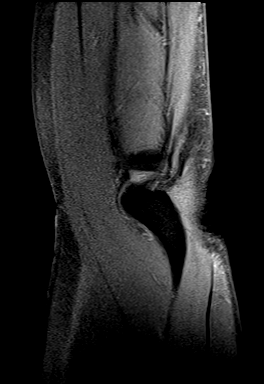
[im 21/24]
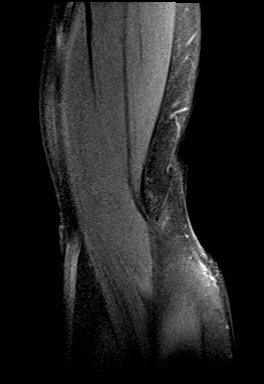
[im 24/24]
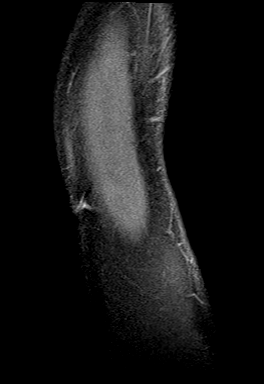

[Series 7: STIR · coronal · right · 3.0mm · 0.44mm/px · 7 of 20 slices shown]
[im 1/20]
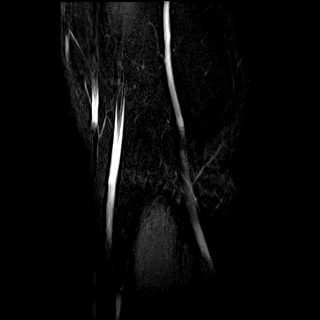
[im 4/20]
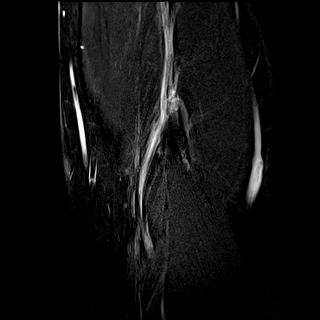
[im 7/20]
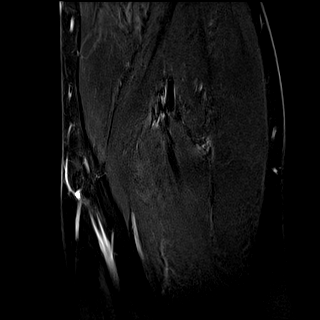
[im 10/20]
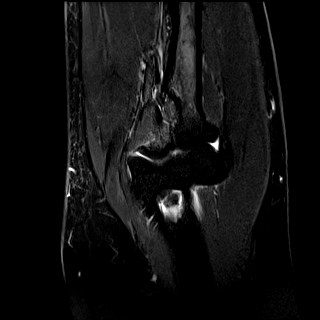
[im 13/20]
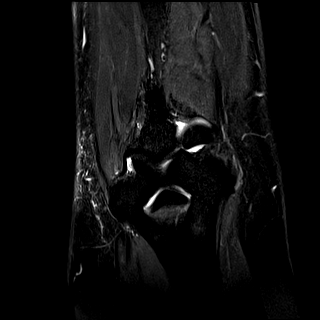
[im 16/20]
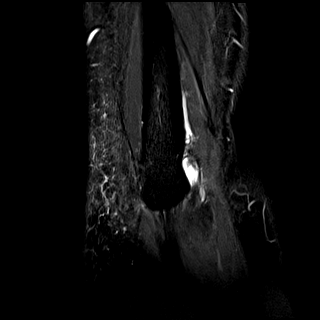
[im 20/20]
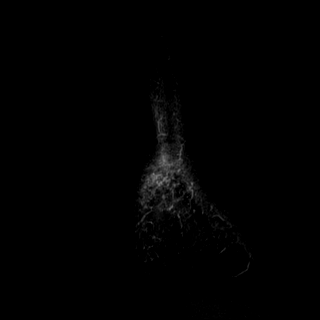

[40 of 40 positions shown; findings below may reference images not displayed]

FINDINGS: TENDONS

Common forearm flexor origin: Intact.

Common forearm extensor origin: Moderate tendinosis of the common
extensor tendon origin with a small interstitial tear.

Biceps: Intact.

Triceps: Intact.

LIGAMENTS

Medial stabilizers: Intact.

Lateral stabilizers:  Intact.

Cartilage: No chondral defect.

Joint: No joint effusion.  No intra-articular loose body.

Cubital tunnel: Normal cubital tunnel. Ulnar nerve is normal in size
and signal.

Bones: No marrow signal abnormality.  No fracture dislocation
IMPRESSION: 1. Moderate tendinosis of the common extensor tendon origin with a
small interstitial tear.

## 2018-08-18 ENCOUNTER — Other Ambulatory Visit: Payer: Self-pay | Admitting: Family Medicine

## 2018-08-18 DIAGNOSIS — Z1231 Encounter for screening mammogram for malignant neoplasm of breast: Secondary | ICD-10-CM

## 2018-08-21 ENCOUNTER — Ambulatory Visit
Admission: RE | Admit: 2018-08-21 | Discharge: 2018-08-21 | Disposition: A | Discharge status: Home / Self Care | Payer: Federal, State, Local not specified - PPO | Source: Ambulatory Visit | Attending: Family Medicine | Admitting: Family Medicine

## 2018-08-21 DIAGNOSIS — Z1231 Encounter for screening mammogram for malignant neoplasm of breast: Secondary | ICD-10-CM

## 2018-09-01 DIAGNOSIS — S00522A Blister (nonthermal) of oral cavity, initial encounter: Secondary | ICD-10-CM | POA: Diagnosis not present

## 2018-09-01 DIAGNOSIS — Z23 Encounter for immunization: Secondary | ICD-10-CM | POA: Diagnosis not present

## 2019-02-06 DIAGNOSIS — J069 Acute upper respiratory infection, unspecified: Secondary | ICD-10-CM | POA: Diagnosis not present

## 2019-07-23 DIAGNOSIS — Z0000 Encounter for general adult medical examination without abnormal findings: Secondary | ICD-10-CM | POA: Diagnosis not present

## 2019-07-24 DIAGNOSIS — E7800 Pure hypercholesterolemia, unspecified: Secondary | ICD-10-CM | POA: Diagnosis not present

## 2020-05-09 DIAGNOSIS — R079 Chest pain, unspecified: Secondary | ICD-10-CM | POA: Diagnosis not present

## 2020-06-02 ENCOUNTER — Other Ambulatory Visit: Payer: Self-pay

## 2020-06-02 ENCOUNTER — Encounter (HOSPITAL_COMMUNITY): Payer: Self-pay | Admitting: Emergency Medicine

## 2020-06-02 ENCOUNTER — Ambulatory Visit (HOSPITAL_COMMUNITY)
Admission: EM | Admit: 2020-06-02 | Discharge: 2020-06-02 | Disposition: A | Discharge status: Home / Self Care | Payer: Federal, State, Local not specified - PPO | Attending: Family Medicine | Admitting: Family Medicine

## 2020-06-02 DIAGNOSIS — R079 Chest pain, unspecified: Secondary | ICD-10-CM | POA: Diagnosis not present

## 2020-06-02 DIAGNOSIS — R1010 Upper abdominal pain, unspecified: Secondary | ICD-10-CM | POA: Diagnosis not present

## 2020-06-02 LAB — POCT URINALYSIS DIPSTICK, ED / UC
Bilirubin Urine: NEGATIVE
Glucose, UA: NEGATIVE mg/dL
Ketones, ur: NEGATIVE mg/dL
Leukocytes,Ua: NEGATIVE
Nitrite: NEGATIVE
Protein, ur: NEGATIVE mg/dL
Specific Gravity, Urine: 1.015 (ref 1.005–1.030)
Urobilinogen, UA: 0.2 mg/dL (ref 0.0–1.0)
pH: 6 (ref 5.0–8.0)

## 2020-06-02 LAB — COMPREHENSIVE METABOLIC PANEL
ALT: 27 U/L (ref 0–44)
AST: 17 U/L (ref 15–41)
Albumin: 4.4 g/dL (ref 3.5–5.0)
Alkaline Phosphatase: 95 U/L (ref 38–126)
Anion gap: 13 (ref 5–15)
BUN: 14 mg/dL (ref 6–20)
CO2: 24 mmol/L (ref 22–32)
Calcium: 9.8 mg/dL (ref 8.9–10.3)
Chloride: 101 mmol/L (ref 98–111)
Creatinine, Ser: 0.73 mg/dL (ref 0.44–1.00)
GFR, Estimated: >60 mL/min (ref >60)
Glucose, Bld: 96 mg/dL (ref 70–99)
Potassium: 3.8 mmol/L (ref 3.5–5.1)
Sodium: 138 mmol/L (ref 135–145)
Total Bilirubin: 0.1 mg/dL — ABNORMAL LOW (ref 0.3–1.2)
Total Protein: 7.5 g/dL (ref 6.5–8.1)

## 2020-06-02 LAB — CBC WITH DIFFERENTIAL/PLATELET
Abs Immature Granulocytes: 0.02 10*3/uL (ref 0.00–0.07)
Basophils Absolute: 0.1 10*3/uL (ref 0.0–0.1)
Basophils Relative: 1 %
Eosinophils Absolute: 0.5 10*3/uL (ref 0.0–0.5)
Eosinophils Relative: 6 %
HCT: 41.7 % (ref 36.0–46.0)
Hemoglobin: 13.5 g/dL (ref 12.0–15.0)
Immature Granulocytes: 0 %
Lymphocytes Relative: 19 %
Lymphs Abs: 1.7 10*3/uL (ref 0.7–4.0)
MCH: 29 pg (ref 26.0–34.0)
MCHC: 32.4 g/dL (ref 30.0–36.0)
MCV: 89.5 fL (ref 80.0–100.0)
Monocytes Absolute: 0.6 10*3/uL (ref 0.1–1.0)
Monocytes Relative: 7 %
Neutro Abs: 6 10*3/uL (ref 1.7–7.7)
Neutrophils Relative %: 67 %
Platelets: 511 10*3/uL — ABNORMAL HIGH (ref 150–400)
RBC: 4.66 MIL/uL (ref 3.87–5.11)
RDW: 12.7 % (ref 11.5–15.5)
WBC: 9 10*3/uL (ref 4.0–10.5)
nRBC: 0 % (ref 0.0–0.2)

## 2020-06-02 LAB — LIPASE, BLOOD: Lipase: 27 U/L (ref 11–51)

## 2020-06-02 LAB — AMYLASE: Amylase: 68 U/L (ref 28–100)

## 2020-06-02 MED ORDER — ALUM & MAG HYDROXIDE-SIMETH 200-200-20 MG/5ML PO SUSP
ORAL | Status: AC
  Filled 2020-06-02: qty 30

## 2020-06-02 MED ORDER — OMEPRAZOLE 20 MG PO CPDR: 20.0000 mg | DELAYED_RELEASE_CAPSULE | Freq: Two times a day (BID) | ORAL | 0 refills | Status: AC

## 2020-06-02 MED ORDER — LIDOCAINE VISCOUS HCL 2 % MT SOLN
OROMUCOSAL | Status: AC
  Filled 2020-06-02: qty 15

## 2020-06-02 MED ORDER — LIDOCAINE VISCOUS HCL 2 % MT SOLN
15.0000 mL | Freq: Once | OROMUCOSAL | Status: AC
  Administered 2020-06-02: 15 mL via ORAL

## 2020-06-02 MED ORDER — ALUM & MAG HYDROXIDE-SIMETH 200-200-20 MG/5ML PO SUSP
30.0000 mL | Freq: Once | ORAL | Status: AC
  Administered 2020-06-02: 30 mL via ORAL

## 2020-06-02 MED ORDER — SUCRALFATE 1 G PO TABS: 1.0000 g | ORAL_TABLET | Freq: Three times a day (TID) | ORAL | 0 refills | Status: DC

## 2020-06-02 NOTE — ED Triage Notes (Signed)
Patient presents to [facility] for assessment of 1 month of intermittent sensation of pressure and pain at the epigastric area as well as pain to her back bilaterally along her bra line.  States this morning at 1am it woke her up out of sleep and has not resolved since.  Patient states she took Advil approx 1pm without any relief.  Patient denies shortness of breath, nausea, light-headedness/dizziness, jaw pain or back pain.

## 2020-06-02 NOTE — Discharge Instructions (Signed)
I believe this may be related to severe GERD or a gastric ulcer Please avoid all NSAIDs to include ibuprofen, or naproxen Take the omeprazole 2 times a day for meal.  Take this 30 minutes before a meal with a full glass of water.  Carafate 4 times a day with meals and at bedtime Small meals  Avoid spicy, greasy foods, caffeine, chocolate and milk products.  No eating 2-3 hours before bedtime. Elevate the head of the bed 30 degrees.  Try this for a few weeks to see if this improves your symptoms.  If you don't see any improvement or your symptoms worsen please follow up with a GI   I am obtaining some blood work and will call you with any abnormal results.  I have put a referral in for GI specialist for follow-up at this does not improve If this pain significantly worsens and you start having other concerning symptoms like vomiting, fevers, chest pain or shortness of breath you need to go straight to the ER.

## 2020-06-03 NOTE — ED Provider Notes (Signed)
[facility]    CSN: [id number] Arrival date & time: 06/02/20  1520      History   Chief Complaint Chief Complaint  Patient presents with  . Chest Pain    HPI Lauren Murray is a 54 y.o. female.   Patient is an otherwise healthy 54 year old female presents today with approximately 1 month of intermittent, waxing waning upper abdominal pain.  Describes as pressure in the upper abdominal area and radiate to the back.  Feels like this is worsened over the past few days.  She woke up at 1 AM this morning due to the pain and has since not resolved.  Has been taking NSAIDs which do not help took Advil at 1 PM without any relief.  Denies any associated nausea, vomiting, fevers, chest pain, shortness of breath.  Having regular bowel movements.  Denies any blood in stool.  She does not smoke.     Past Medical History:  Diagnosis Date  . Allergy     Patient Active Problem List   Diagnosis Date Noted  . Right arm pain 10/17/2016  . Lateral epicondylitis, right elbow 09/14/2016    Past Surgical History:  Procedure Laterality Date  . TUBAL LIGATION      OB History   No obstetric history on file.      Home Medications    Prior to Admission medications   Medication Sig Start Date End Date Taking? Authorizing Provider  diclofenac (VOLTAREN) 75 MG EC tablet Take 75 mg by mouth 2 (two) times daily.    [provider]  Diclofenac Sodium (PENNSAID) 2 % SOLN Place 2 Squirts onto the skin 2 (two) times daily. 11/14/16   Cammy Copa, MD  naproxen sodium (ANAPROX) 220 MG tablet Take 220 mg by mouth 2 (two) times daily with a meal.    [provider]  nitroGLYCERIN (NITRO-DUR) 0.2 mg/hr patch Apply 1/4 patch to right elbow once daily 11/14/16   Cammy Copa, MD  omeprazole (PRILOSEC) 20 MG capsule Take 1 capsule (20 mg total) by mouth 2 (two) times daily before a meal. 06/02/20   Laniya Friedl A, NP  sucralfate (CARAFATE) 1 g tablet Take 1  tablet (1 g total) by mouth 4 (four) times daily -  with meals and at bedtime. 06/02/20   Janace Aris, NP  venlafaxine XR (EFFEXOR-XR) 75 MG 24 hr capsule  08/04/16   [provider]    Family History Family History  Problem Relation Age of Onset  . Cancer Mother   . Cancer Paternal Grandmother     Social History Social History   Tobacco Use  . Smoking status: Never Smoker  . Smokeless tobacco: Never Used  Substance Use Topics  . Alcohol use: No  . Drug use: No     Allergies   Patient has no known allergies.   Review of Systems Review of Systems   Physical Exam Triage Vital Signs ED Triage Vitals [06/02/20 1534]  Enc Vitals Group     BP (!) 186/89     Pulse Rate 100     Resp      Temp 97.8 F (36.6 C)     Temp Source Oral     SpO2 98 %     Weight      Height      Head Circumference      Peak Flow      Pain Score 9     Pain Loc  Pain Edu?      Excl. in GC?    No data found.  Updated Vital Signs BP (!) 153/68 (BP Location: Left Arm)   Pulse 88   Temp 97.8 F (36.6 C) (Oral)   LMP 10/28/2016   SpO2 98%   Visual Acuity Right Eye Distance:   Left Eye Distance:   Bilateral Distance:    Right Eye Near:   Left Eye Near:    Bilateral Near:     Physical Exam Vitals and nursing note reviewed.  Constitutional:      General: She is not in acute distress.    Appearance: Normal appearance. She is not ill-appearing, toxic-appearing or diaphoretic.  HENT:     Head: Normocephalic.     Nose: Nose normal.  Eyes:     Conjunctiva/sclera: Conjunctivae normal.  Cardiovascular:     Rate and Rhythm: Normal rate and regular rhythm.  Pulmonary:     Effort: Pulmonary effort is normal.     Breath sounds: Normal breath sounds.  Abdominal:     General: There is no distension.     Palpations: Abdomen is soft. There is no mass.     Tenderness: There is abdominal tenderness. There is no right CVA tenderness, left CVA tenderness or rebound.      Hernia: No hernia is present.     Comments: Tenderness to entire upper quadrant area worse in epigastric area. Tenderness to upper thoracic area  Musculoskeletal:        General: Normal range of motion.     Cervical back: Normal range of motion.  Skin:    General: Skin is warm and dry.     Findings: No rash.  Neurological:     Mental Status: She is alert.  Psychiatric:        Mood and Affect: Mood normal.      UC Treatments / Results  Labs (all labs ordered are listed, but only abnormal results are displayed) Labs Reviewed  CBC WITH DIFFERENTIAL/PLATELET - Abnormal; Notable for the following components:      Result Value   Platelets 511 (*)    All other components within normal limits  COMPREHENSIVE METABOLIC PANEL - Abnormal; Notable for the following components:   Total Bilirubin 0.1 (*)    All other components within normal limits  POCT URINALYSIS DIPSTICK, ED / UC - Abnormal; Notable for the following components:   Hgb urine dipstick SMALL (*)    All other components within normal limits  LIPASE, BLOOD  AMYLASE    EKG   Radiology No results found.  Procedures Procedures (including critical care time)  Medications Ordered in UC Medications  alum & mag hydroxide-simeth (MAALOX/MYLANTA) 200-200-20 MG/5ML suspension 30 mL (30 mLs Oral Given 06/02/20 1614)    And  lidocaine (XYLOCAINE) 2 % viscous mouth solution 15 mL (15 mLs Oral Given 06/02/20 1614)    Initial Impression / Assessment and Plan / UC Course  I have reviewed the triage vital signs and the nursing notes.  Pertinent labs & imaging results that were available during my care of the patient were reviewed by me and considered in my medical decision making (see chart for details).     Upper abdominal pain Believe this may be related to severe GERD or gastric ulcer. EKG performed here with sinus tachycardia but otherwise normal.  No concerns for ACS at this time. Patient heart rate decreased to 88  upon recheck.  Blood pressure decreased to 153/68 Recommended avoiding all NSAIDs.  Omeprazole 2 times a day before meals.  Diet instructions and precautions given.  Instructions given on how to take the medication.  Carafate 3 times a day with meals and at bedtime. Blood work unremarkable Urine without infection or any concerns Referral put in for GI specialist for follow-up Strict ER return precautions given  Final Clinical Impressions(s) / UC Diagnoses   Final diagnoses:  Upper abdominal pain     Discharge Instructions     I believe this may be related to severe GERD or a gastric ulcer Please avoid all NSAIDs to include ibuprofen, or naproxen Take the omeprazole 2 times a day for meal.  Take this 30 minutes before a meal with a full glass of water.  Carafate 4 times a day with meals and at bedtime Small meals  Avoid spicy, greasy foods, caffeine, chocolate and milk products.  No eating 2-3 hours before bedtime. Elevate the head of the bed 30 degrees.  Try this for a few weeks to see if this improves your symptoms.  If you don't see any improvement or your symptoms worsen please follow up with a GI   I am obtaining some blood work and will call you with any abnormal results.  I have put a referral in for GI specialist for follow-up at this does not improve If this pain significantly worsens and you start having other concerning symptoms like vomiting, fevers, chest pain or shortness of breath you need to go straight to the ER.     ED Prescriptions    Medication Sig Dispense Auth. Provider   omeprazole (PRILOSEC) 20 MG capsule Take 1 capsule (20 mg total) by mouth 2 (two) times daily before a meal. 30 capsule Umaima Scholten A, NP   sucralfate (CARAFATE) 1 g tablet Take 1 tablet (1 g total) by mouth 4 (four) times daily -  with meals and at bedtime. 15 tablet Kelcey Korus A, NP     PDMP not reviewed this encounter.   Janace Aris, NP 06/03/20 [phone]

## 2020-07-28 DIAGNOSIS — I10 Essential (primary) hypertension: Secondary | ICD-10-CM | POA: Diagnosis not present

## 2020-07-28 DIAGNOSIS — Z0000 Encounter for general adult medical examination without abnormal findings: Secondary | ICD-10-CM | POA: Diagnosis not present

## 2020-07-28 DIAGNOSIS — E7800 Pure hypercholesterolemia, unspecified: Secondary | ICD-10-CM | POA: Diagnosis not present

## 2020-11-23 DIAGNOSIS — E7800 Pure hypercholesterolemia, unspecified: Secondary | ICD-10-CM | POA: Diagnosis not present

## 2021-01-26 DIAGNOSIS — E7800 Pure hypercholesterolemia, unspecified: Secondary | ICD-10-CM | POA: Diagnosis not present

## 2021-01-26 DIAGNOSIS — F39 Unspecified mood [affective] disorder: Secondary | ICD-10-CM | POA: Diagnosis not present

## 2021-01-26 DIAGNOSIS — I10 Essential (primary) hypertension: Secondary | ICD-10-CM | POA: Diagnosis not present

## 2021-06-07 DIAGNOSIS — E7800 Pure hypercholesterolemia, unspecified: Secondary | ICD-10-CM | POA: Diagnosis not present

## 2022-01-09 DIAGNOSIS — K0510 Chronic gingivitis, plaque induced: Secondary | ICD-10-CM | POA: Diagnosis not present

## 2022-01-09 DIAGNOSIS — S00522A Blister (nonthermal) of oral cavity, initial encounter: Secondary | ICD-10-CM | POA: Diagnosis not present

## 2022-01-29 DIAGNOSIS — E7800 Pure hypercholesterolemia, unspecified: Secondary | ICD-10-CM | POA: Diagnosis not present

## 2022-01-29 DIAGNOSIS — E669 Obesity, unspecified: Secondary | ICD-10-CM | POA: Diagnosis not present

## 2022-01-29 DIAGNOSIS — I10 Essential (primary) hypertension: Secondary | ICD-10-CM | POA: Diagnosis not present

## 2022-01-29 DIAGNOSIS — F39 Unspecified mood [affective] disorder: Secondary | ICD-10-CM | POA: Diagnosis not present

## 2022-07-31 DIAGNOSIS — Z0000 Encounter for general adult medical examination without abnormal findings: Secondary | ICD-10-CM | POA: Diagnosis not present

## 2022-07-31 DIAGNOSIS — E7800 Pure hypercholesterolemia, unspecified: Secondary | ICD-10-CM | POA: Diagnosis not present

## 2022-07-31 DIAGNOSIS — I10 Essential (primary) hypertension: Secondary | ICD-10-CM | POA: Diagnosis not present

## 2022-07-31 DIAGNOSIS — R7301 Impaired fasting glucose: Secondary | ICD-10-CM | POA: Diagnosis not present

## 2022-07-31 DIAGNOSIS — Z124 Encounter for screening for malignant neoplasm of cervix: Secondary | ICD-10-CM | POA: Diagnosis not present

## 2022-08-02 ENCOUNTER — Other Ambulatory Visit: Payer: Self-pay | Admitting: Internal Medicine

## 2022-08-02 DIAGNOSIS — Z1231 Encounter for screening mammogram for malignant neoplasm of breast: Secondary | ICD-10-CM

## 2022-08-03 ENCOUNTER — Ambulatory Visit
Admission: RE | Admit: 2022-08-03 | Discharge: 2022-08-03 | Disposition: A | Discharge status: Home / Self Care | Payer: Federal, State, Local not specified - PPO | Source: Ambulatory Visit | Attending: Internal Medicine | Admitting: Internal Medicine

## 2022-08-03 DIAGNOSIS — Z1231 Encounter for screening mammogram for malignant neoplasm of breast: Secondary | ICD-10-CM | POA: Diagnosis not present

## 2022-10-15 DIAGNOSIS — E669 Obesity, unspecified: Secondary | ICD-10-CM | POA: Diagnosis not present

## 2022-10-15 DIAGNOSIS — B356 Tinea cruris: Secondary | ICD-10-CM | POA: Diagnosis not present

## 2022-10-15 DIAGNOSIS — I10 Essential (primary) hypertension: Secondary | ICD-10-CM | POA: Diagnosis not present

## 2022-10-15 DIAGNOSIS — Z6836 Body mass index (BMI) 36.0-36.9, adult: Secondary | ICD-10-CM | POA: Diagnosis not present

## 2022-12-18 DIAGNOSIS — Z0289 Encounter for other administrative examinations: Secondary | ICD-10-CM

## 2022-12-19 ENCOUNTER — Encounter (INDEPENDENT_AMBULATORY_CARE_PROVIDER_SITE_OTHER): Payer: Federal, State, Local not specified - PPO | Admitting: Family Medicine

## 2023-01-09 ENCOUNTER — Encounter (INDEPENDENT_AMBULATORY_CARE_PROVIDER_SITE_OTHER): Payer: Self-pay | Admitting: Family Medicine

## 2023-01-09 ENCOUNTER — Ambulatory Visit (INDEPENDENT_AMBULATORY_CARE_PROVIDER_SITE_OTHER): Payer: Federal, State, Local not specified - PPO | Admitting: Family Medicine

## 2023-01-09 VITALS — BP 130/83 | HR 84 | Temp 98.1°F | Ht 61.0 in | Wt 178.0 lb

## 2023-01-09 DIAGNOSIS — R739 Hyperglycemia, unspecified: Secondary | ICD-10-CM

## 2023-01-09 DIAGNOSIS — R0602 Shortness of breath: Secondary | ICD-10-CM

## 2023-01-09 DIAGNOSIS — R5383 Other fatigue: Secondary | ICD-10-CM

## 2023-01-09 DIAGNOSIS — E7849 Other hyperlipidemia: Secondary | ICD-10-CM

## 2023-01-09 DIAGNOSIS — F419 Anxiety disorder, unspecified: Secondary | ICD-10-CM | POA: Diagnosis not present

## 2023-01-09 DIAGNOSIS — I10 Essential (primary) hypertension: Secondary | ICD-10-CM

## 2023-01-09 DIAGNOSIS — E669 Obesity, unspecified: Secondary | ICD-10-CM

## 2023-01-09 DIAGNOSIS — E66811 Obesity, class 1: Secondary | ICD-10-CM

## 2023-01-09 DIAGNOSIS — E668 Other obesity: Secondary | ICD-10-CM

## 2023-01-09 DIAGNOSIS — F32A Depression, unspecified: Secondary | ICD-10-CM

## 2023-01-09 DIAGNOSIS — Z6833 Body mass index (BMI) 33.0-33.9, adult: Secondary | ICD-10-CM

## 2023-01-09 NOTE — Progress Notes (Signed)
Chief Complaint:   OBESITY Lauren Murray (MR# [id number]) is a 57 y.o. female who presents for evaluation and treatment of obesity and related comorbidities. Current BMI is Body mass index is 33.63 kg/m. Lauren Murray has been struggling with her weight for many years and has been unsuccessful in either losing weight, maintaining weight loss, or reaching her healthy weight goal.  Lauren Murray is currently in the action stage of change and ready to dedicate time achieving and maintaining a healthier weight. Lauren Murray is interested in becoming our patient and working on intensive lifestyle modifications including (but not limited to) diet and exercise for weight loss.  Patient heard about clinic from her friend that came to clinic.  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  Works 56 hours a week (works 6:30a- 7p) 6 days a week. Lives at home with her husband and grandson.  Family is supportive and eats meals together and will be changing how they eat.  Has recently started walking during her lunch break. Desired weight is 145lbs and last time she was that weight was in 2007 when she quit smoking and then steadily gained after that. Does do some nervous eating. Has tried many different diets previously. Patient is the one that grocery shops and cooks.  Sometimes skips dinner.   Food recall: Coffee in the am with cream. Cup of water in the am with 2 hard boiled egg whites.  Then does half cup of black olives with a square of cheese (feels satisfied). Lunch is leftovers from the night before or salad with avocado, cucumber, lettuce and celery, tomato occasionally with protein and no dressing.  Leftovers could be 1.5cup of taco meat and pasta or chilli with cheese and sour cream.  Feels satisfied from this.  Sometimes may have a pack of crackers, chips or celery and PB for snack.  This is habitual.  Dinner varies- taco, chili or grilled ribs.  2-3 white claws after work daily.   Lauren Murray's habits were reviewed  today and are as follows: Her family eats meals together, she thinks her family will eat healthier with her, her desired weight loss is 33 lbs, she started gaining weight in 2007, her heaviest weight ever was 189 pounds, she has significant food cravings issues, she skips meals frequently, she is frequently drinking liquids with calories, she frequently makes poor food choices, she has problems with excessive hunger, and she struggles with emotional eating.  Depression Screen Lauren Murray's Food and Mood (modified PHQ-9) score was 12.  Subjective:   1. Other fatigue Kaniyah admits to daytime somnolence and admits to waking up still tired. Patient has a history of symptoms of daytime fatigue and morning fatigue. Lauren Murray generally gets 6 hours of sleep per night, and states that she has nightime awakenings and generally restful sleep. Snoring is present. Apneic episodes are not present. Epworth Sleepiness Score is 13.  EKG-normal sinus rhythm at 81 bpm.  2. SOBOE (shortness of breath on exertion) Sury notes increasing shortness of breath with exercising and seems to be worsening over time with weight gain. She notes getting out of breath sooner with activity than she used to. This has not gotten worse recently. Caty denies shortness of breath at rest or orthopnea.  3. Primary hypertension Patient was diagnosed a few years ago.  She is on hydrochlorothiazide 12.5 mg daily.  4. Other hyperlipidemia Patient was diagnosed many years ago.  She is on atorvastatin 40 mg daily.  5. Hyperglycemia Patient  has had elevated blood sugars in the past.  No A1c on labs in Epic.  6. Anxiety and depression Patient has been on Effexor for many years.  She feels well-managed.  She denies suicidal or homicidal ideations.  She notes minimally emotionally eating.  Assessment/Plan:   1. Other fatigue Lauren Murray does feel that her weight is causing her energy to be lower than it should be. Fatigue may be related to obesity,  depression or many other causes. Labs will be ordered, and in the meanwhile, Maddux will focus on self care including making healthy food choices, increasing physical activity and focusing on stress reduction.  - EKG 12-Lead - Vitamin B12 - Folate - VITAMIN D 25 Hydroxy (Vit-D Deficiency, Fractures) - TSH - T4, free - T3  2. SOBOE (shortness of breath on exertion) Lauren Murray does feel that she gets out of breath more easily that she used to when she exercises. Lauren Murray's shortness of breath appears to be obesity related and exercise induced. She has agreed to work on weight loss and gradually increase exercise to treat her exercise induced shortness of breath. Will continue to monitor closely.  - CBC with Differential/Platelet  3. Primary hypertension We will check labs today, and we will follow-up at her next appointment.  - Comprehensive metabolic panel  4. Other hyperlipidemia We will check labs today, we will follow-up at her next appointment.  - Lipid Panel With LDL/HDL Ratio  5. Hyperglycemia We will check labs today, and we will follow-up at her next appointment.  - Hemoglobin A1c - Insulin, random  6. Anxiety and depression We will follow-up on patient's symptoms at her next appointment.  7. Class 1 obesity with serious comorbidity and body mass index (BMI) of 33.0 to 33.9 in adult, unspecified obesity type Lauren Murray is currently in the action stage of change and her goal is to continue with weight loss efforts. I recommend Fable begin the structured treatment plan as follows:  She has agreed to the Category 3 Plan.  Exercise goals: No exercise has been prescribed at this time.   Behavioral modification strategies: increasing lean protein intake, meal planning and cooking strategies, keeping healthy foods in the home, and planning for success.  She was informed of the importance of frequent follow-up visits to maximize her success with intensive lifestyle modifications for her  multiple health conditions. She was informed we would discuss her lab results at her next visit unless there is a critical issue that needs to be addressed sooner. Lauren Murray agreed to keep her next visit at the agreed upon time to discuss these results.  Objective:   Blood pressure 130/83, pulse 84, temperature 98.1 F (36.7 C), height 5\' 1"  (1.549 m), weight 178 lb (80.7 kg), last menstrual period 10/28/2016, SpO2 98 %. Body mass index is 33.63 kg/m.  EKG: Normal sinus rhythm, rate 81 BPM.  Indirect Calorimeter completed today shows a VO2 of 253 and a REE of 1742.  Her calculated basal metabolic rate is 1610 thus her basal metabolic rate is better than expected.  General: Cooperative, alert, well developed, in no acute distress. HEENT: Conjunctivae and lids unremarkable. Cardiovascular: Regular rhythm.  Lungs: Normal work of breathing. Neurologic: No focal deficits.   Lab Results  Component Value Date   CREATININE 0.73 06/02/2020   BUN 14 06/02/2020   NA 138 06/02/2020   K 3.8 06/02/2020   CL 101 06/02/2020   CO2 24 06/02/2020   Lab Results  Component Value Date   ALT 27 06/02/2020  AST 17 06/02/2020   ALKPHOS 95 06/02/2020   BILITOT 0.1 (L) 06/02/2020   No results found for: "HGBA1C" No results found for: "INSULIN" No results found for: "TSH" No results found for: "CHOL", "HDL", "LDLCALC", "LDLDIRECT", "TRIG", "CHOLHDL" Lab Results  Component Value Date   WBC 9.0 06/02/2020   HGB 13.5 06/02/2020   HCT 41.7 06/02/2020   MCV 89.5 06/02/2020   PLT 511 (H) 06/02/2020   No results found for: "IRON", "TIBC", "FERRITIN"  Attestation Statements:   Reviewed by clinician on day of visit: allergies, medications, problem list, medical history, surgical history, family history, social history, and previous encounter notes.  Time spent on visit including pre-visit chart review and post-visit charting and care was 45 minutes.  This is the patient's first visit at [facility]. The patient's NEW PATIENT PACKET was reviewed at length. Included in the packet: current and past health history, medications, allergies, ROS, gynecologic history (women only), surgical history, family history, social history, weight history, weight loss surgery history (for those that have had weight loss surgery), nutritional evaluation, mood and food questionnaire, PHQ9, Epworth questionnaire, sleep habits questionnaire, patient life and health improvement goals questionnaire. These will all be scanned into the patient's chart under media.   During the visit, I independently reviewed the patient's EKG, bioimpedance scale results, and indirect calorimeter results. I used this information to tailor a meal plan for the patient that will help her to lose weight and will improve her obesity-related conditions going forward. I performed a medically necessary appropriate examination and/or evaluation. I discussed the assessment and treatment plan with the patient. The patient was provided an opportunity to ask questions and all were answered. The patient agreed with the plan and demonstrated an understanding of the instructions. Labs were ordered at this visit and will be reviewed at the next visit unless more critical results need to be addressed immediately. Clinical information was updated and documented in the EMR.     I, Burt Knack, am acting as transcriptionist for Reuben Likes, MD.  I have reviewed the above documentation for accuracy and completeness, and I agree with the above. - Reuben Likes, MD

## 2023-01-10 LAB — COMPREHENSIVE METABOLIC PANEL
ALT: 28 IU/L (ref 0–32)
AST: 20 IU/L (ref 0–40)
Albumin/Globulin Ratio: 1.9 (ref 1.2–2.2)
Albumin: 4.9 g/dL (ref 3.8–4.9)
Alkaline Phosphatase: 90 IU/L (ref 44–121)
BUN/Creatinine Ratio: 16 (ref 9–23)
BUN: 12 mg/dL (ref 6–24)
Bilirubin Total: 0.4 mg/dL (ref 0.0–1.2)
CO2: 24 mmol/L (ref 20–29)
Calcium: 10 mg/dL (ref 8.7–10.2)
Chloride: 99 mmol/L (ref 96–106)
Creatinine, Ser: 0.76 mg/dL (ref 0.57–1.00)
Globulin, Total: 2.6 g/dL (ref 1.5–4.5)
Glucose: 90 mg/dL (ref 70–99)
Potassium: 3.9 mmol/L (ref 3.5–5.2)
Sodium: 140 mmol/L (ref 134–144)
Total Protein: 7.5 g/dL (ref 6.0–8.5)
eGFR: 91 mL/min/{1.73_m2} (ref >59)

## 2023-01-10 LAB — CBC WITH DIFFERENTIAL/PLATELET
Basophils Absolute: 0.1 10*3/uL (ref 0.0–0.2)
Basos: 1 %
EOS (ABSOLUTE): 0.3 10*3/uL (ref 0.0–0.4)
Eos: 3 %
Hematocrit: 46.8 % — ABNORMAL HIGH (ref 34.0–46.6)
Hemoglobin: 14.9 g/dL (ref 11.1–15.9)
Immature Grans (Abs): 0 10*3/uL (ref 0.0–0.1)
Immature Granulocytes: 0 %
Lymphocytes Absolute: 1.4 10*3/uL (ref 0.7–3.1)
Lymphs: 15 %
MCH: 28.7 pg (ref 26.6–33.0)
MCHC: 31.8 g/dL (ref 31.5–35.7)
MCV: 90 fL (ref 79–97)
Monocytes Absolute: 0.4 10*3/uL (ref 0.1–0.9)
Monocytes: 4 %
Neutrophils Absolute: 7.3 10*3/uL — ABNORMAL HIGH (ref 1.4–7.0)
Neutrophils: 77 %
Platelets: 365 10*3/uL (ref 150–450)
RBC: 5.2 x10E6/uL (ref 3.77–5.28)
RDW: 12.3 % (ref 11.7–15.4)
WBC: 9.4 10*3/uL (ref 3.4–10.8)

## 2023-01-10 LAB — LIPID PANEL WITH LDL/HDL RATIO
Cholesterol, Total: 162 mg/dL (ref 100–199)
HDL: 60 mg/dL (ref >39)
LDL Chol Calc (NIH): 84 mg/dL (ref 0–99)
LDL/HDL Ratio: 1.4 ratio (ref 0.0–3.2)
Triglycerides: 98 mg/dL (ref 0–149)
VLDL Cholesterol Cal: 18 mg/dL (ref 5–40)

## 2023-01-10 LAB — FOLATE: Folate: 6.8 ng/mL (ref >3.0)

## 2023-01-10 LAB — INSULIN, RANDOM: INSULIN: 7.3 u[IU]/mL (ref 2.6–24.9)

## 2023-01-10 LAB — T3: T3, Total: 93 ng/dL (ref 71–180)

## 2023-01-10 LAB — HEMOGLOBIN A1C
Est. average glucose Bld gHb Est-mCnc: 120 mg/dL
Hgb A1c MFr Bld: 5.8 % — ABNORMAL HIGH (ref 4.8–5.6)

## 2023-01-10 LAB — T4, FREE: Free T4: 0.97 ng/dL (ref 0.82–1.77)

## 2023-01-10 LAB — VITAMIN B12: Vitamin B-12: 753 pg/mL (ref 232–1245)

## 2023-01-10 LAB — VITAMIN D 25 HYDROXY (VIT D DEFICIENCY, FRACTURES): Vit D, 25-Hydroxy: 22 ng/mL — ABNORMAL LOW (ref 30.0–100.0)

## 2023-01-10 LAB — TSH: TSH: 2.36 u[IU]/mL (ref 0.450–4.500)

## 2023-01-16 DIAGNOSIS — L03113 Cellulitis of right upper limb: Secondary | ICD-10-CM | POA: Diagnosis not present

## 2023-01-16 DIAGNOSIS — Z6834 Body mass index (BMI) 34.0-34.9, adult: Secondary | ICD-10-CM | POA: Diagnosis not present

## 2023-01-16 DIAGNOSIS — T148XXA Other injury of unspecified body region, initial encounter: Secondary | ICD-10-CM | POA: Diagnosis not present

## 2023-01-16 DIAGNOSIS — R030 Elevated blood-pressure reading, without diagnosis of hypertension: Secondary | ICD-10-CM | POA: Diagnosis not present

## 2023-01-23 ENCOUNTER — Encounter (INDEPENDENT_AMBULATORY_CARE_PROVIDER_SITE_OTHER): Payer: Self-pay | Admitting: Family Medicine

## 2023-01-23 ENCOUNTER — Ambulatory Visit (INDEPENDENT_AMBULATORY_CARE_PROVIDER_SITE_OTHER): Payer: Federal, State, Local not specified - PPO | Admitting: Family Medicine

## 2023-01-23 VITALS — BP 127/69 | HR 104 | Temp 98.4°F | Ht 61.0 in | Wt 181.0 lb

## 2023-01-23 DIAGNOSIS — I10 Essential (primary) hypertension: Secondary | ICD-10-CM | POA: Diagnosis not present

## 2023-01-23 DIAGNOSIS — Z6834 Body mass index (BMI) 34.0-34.9, adult: Secondary | ICD-10-CM

## 2023-01-23 DIAGNOSIS — R7303 Prediabetes: Secondary | ICD-10-CM

## 2023-01-23 DIAGNOSIS — E559 Vitamin D deficiency, unspecified: Secondary | ICD-10-CM

## 2023-01-23 DIAGNOSIS — E669 Obesity, unspecified: Secondary | ICD-10-CM

## 2023-01-23 MED ORDER — VITAMIN D (ERGOCALCIFEROL) 1.25 MG (50000 UNIT) PO CAPS: 50000.0000 [IU] | ORAL_CAPSULE | ORAL | 0 refills | Status: DC

## 2023-01-23 MED ORDER — METFORMIN HCL 500 MG PO TABS: 500.0000 mg | ORAL_TABLET | Freq: Every day | ORAL | 0 refills | Status: DC

## 2023-01-23 NOTE — Progress Notes (Unsigned)
Chief Complaint:   OBESITY Lauren Murray is here to discuss her progress with her obesity treatment plan along with follow-up of her obesity related diagnoses. Lauren Murray is on the Category 3 Plan and states she is following her eating plan approximately 90% of the time. Lauren Murray states she is walking for 30 minutes 5 times per week.  Today's visit was #: 2 Starting weight: 178 lbs Starting date: 01/09/2023 Today's weight: 181 lbs Today's date: 01/23/2023 Total lbs lost to date: 0 Total lbs lost since last in-office visit: 0  Interim History: Patient feels like she is eating all the time.  Was able to get all 10oz at supper. She was getting 8oz at lunch.  She feels like she is hungry all the time. For snack calories she is doing 2 white claws and then did a [place] and string cheese wrap after lunch.  Food recall: 1st day is 6 egg whites but then switched to 3 scrambled eggs with fairlife milk and salt and pepper with yogurt and 2 pieces toast.  Lunch of [place] 45 calorie bread, lettuce, [place], mustard, onions, peppers.  Snack was [place] wrap with string cheese.  Ate the individual cottage cheese before supper and quarter cup of strawberries and blackberries mixed together. Supper was meat protein (10oz) and then broccoli or riced cauliflower (1 cup) with spray butter.    Does not feel satisfied after her meals.  Subjective:   1. Prediabetes Patient's recent A1c was 5.8 and insulin 7.3.  2. Vitamin D deficiency Patient is not on vitamin D supplementation.  She denies nausea, vomiting, or muscle weakness but notes fatigue.  3. Essential hypertension Patient is on hydrochlorothiazide 12.5 mg.  She denies chest pain, chest pressure, or headache.  Her blood pressure is well-controlled today.  Assessment/Plan:   1. Prediabetes Patient agreed to start metformin 500 mg daily with no refills.  Pathophysiology of insulin resistance, prediabetes, and diabetes mellitus were discussed with the  patient today.  Insulin level is lower than expected.  - metFORMIN (GLUCOPHAGE) 500 MG tablet; Take 1 tablet (500 mg total) by mouth daily with breakfast.  Dispense: 30 tablet; Refill: 0  2. Vitamin D deficiency Patient agreed to start prescription vitamin D 50,000 IU once weekly with no refills.  - Vitamin D, Ergocalciferol, (DRISDOL) 1.25 MG (50000 UNIT) CAPS capsule; Take 1 capsule (50,000 Units total) by mouth every 7 (seven) days.  Dispense: 4 capsule; Refill: 0  3. Essential hypertension Patient will continue hydrochlorothiazide with no change in dose.  4. BMI 34.0-34.9,adult  5. Obesity with starting BMI of 33.6 Lauren Murray is currently in the action stage of change. As such, her goal is to continue with weight loss efforts. She has agreed to the Category 3 Plan.   Exercise goals: All adults should avoid inactivity. Some physical activity is better than none, and adults who participate in any amount of physical activity gain some health benefits.  Behavioral modification strategies: increasing lean protein intake, meal planning and cooking strategies, keeping healthy foods in the home, and planning for success.  Lauren Murray has agreed to follow-up with our clinic in 2 weeks. She was informed of the importance of frequent follow-up visits to maximize her success with intensive lifestyle modifications for her multiple health conditions.   Objective:   Blood pressure 127/69, pulse (!) 104, temperature 98.4 F (36.9 C), height 5\' 1"  (1.549 m), weight 181 lb (82.1 kg), last menstrual period 10/28/2016, SpO2 97 %. Body mass index is 34.2 kg/m.  General: Cooperative, alert, well developed, in no acute distress. HEENT: Conjunctivae and lids unremarkable. Cardiovascular: Regular rhythm.  Lungs: Normal work of breathing. Neurologic: No focal deficits.   Lab Results  Component Value Date   CREATININE 0.76 01/09/2023   BUN 12 01/09/2023   NA 140 01/09/2023   K 3.9 01/09/2023   CL 99  01/09/2023   CO2 24 01/09/2023   Lab Results  Component Value Date   ALT 28 01/09/2023   AST 20 01/09/2023   ALKPHOS 90 01/09/2023   BILITOT 0.4 01/09/2023   Lab Results  Component Value Date   HGBA1C 5.8 (H) 01/09/2023   Lab Results  Component Value Date   INSULIN 7.3 01/09/2023   Lab Results  Component Value Date   TSH 2.360 01/09/2023   Lab Results  Component Value Date   CHOL 162 01/09/2023   HDL 60 01/09/2023   LDLCALC 84 01/09/2023   TRIG 98 01/09/2023   Lab Results  Component Value Date   VD25OH 22.0 (L) 01/09/2023   Lab Results  Component Value Date   WBC 9.4 01/09/2023   HGB 14.9 01/09/2023   HCT 46.8 (H) 01/09/2023   MCV 90 01/09/2023   PLT 365 01/09/2023   No results found for: "IRON", "TIBC", "FERRITIN"  Attestation Statements:   Reviewed by clinician on day of visit: allergies, medications, problem list, medical history, surgical history, family history, social history, and previous encounter notes.  Time spent on visit including pre-visit chart review and post-visit care and charting was 42 minutes.   I, Burt Knack, am acting as transcriptionist for Reuben Likes, MD.  I have reviewed the above documentation for accuracy and completeness, and I agree with the above. - Reuben Likes, MD

## 2023-01-30 DIAGNOSIS — E7800 Pure hypercholesterolemia, unspecified: Secondary | ICD-10-CM | POA: Diagnosis not present

## 2023-01-30 DIAGNOSIS — I10 Essential (primary) hypertension: Secondary | ICD-10-CM | POA: Diagnosis not present

## 2023-01-30 DIAGNOSIS — E669 Obesity, unspecified: Secondary | ICD-10-CM | POA: Diagnosis not present

## 2023-01-30 DIAGNOSIS — F39 Unspecified mood [affective] disorder: Secondary | ICD-10-CM | POA: Diagnosis not present

## 2023-02-06 ENCOUNTER — Ambulatory Visit (INDEPENDENT_AMBULATORY_CARE_PROVIDER_SITE_OTHER): Payer: Federal, State, Local not specified - PPO | Admitting: Family Medicine

## 2023-02-06 ENCOUNTER — Encounter (INDEPENDENT_AMBULATORY_CARE_PROVIDER_SITE_OTHER): Payer: Self-pay | Admitting: Family Medicine

## 2023-02-06 VITALS — BP 125/74 | HR 99 | Temp 98.4°F | Ht 61.0 in | Wt 178.0 lb

## 2023-02-06 DIAGNOSIS — R632 Polyphagia: Secondary | ICD-10-CM | POA: Diagnosis not present

## 2023-02-06 DIAGNOSIS — R7303 Prediabetes: Secondary | ICD-10-CM

## 2023-02-06 DIAGNOSIS — E669 Obesity, unspecified: Secondary | ICD-10-CM | POA: Diagnosis not present

## 2023-02-06 DIAGNOSIS — E559 Vitamin D deficiency, unspecified: Secondary | ICD-10-CM | POA: Diagnosis not present

## 2023-02-06 DIAGNOSIS — Z6833 Body mass index (BMI) 33.0-33.9, adult: Secondary | ICD-10-CM

## 2023-02-06 MED ORDER — VITAMIN D (ERGOCALCIFEROL) 1.25 MG (50000 UNIT) PO CAPS: 50000.0000 [IU] | ORAL_CAPSULE | ORAL | 0 refills | Status: DC

## 2023-02-06 MED ORDER — METFORMIN HCL 500 MG PO TABS: 1000.0000 mg | ORAL_TABLET | Freq: Every day | ORAL | 0 refills | Status: DC

## 2023-02-06 NOTE — Progress Notes (Unsigned)
Chief Complaint:   OBESITY Lauren Murray is here to discuss her progress with her obesity treatment plan along with follow-up of her obesity related diagnoses. Lauren Murray is on the Category 3 Plan and states she is following her eating plan approximately 95% of the time. Lauren Murray states she is walking 30 minutes 7 times per week.  Today's visit was #: 3 Starting weight: 178 lb Starting date: 01/09/2023 Today's weight: 178 lb Today's date: 02/06/2023 Total lbs lost to date: 0 Total lbs lost since last in-office visit: 3 lb  Interim History: Patient has been mostly working the last few weeks.  She feels she is starving.  She feels hungry after every meal. She is able to eat around 10oz of meat and 1 cup of vegetables and a yogurt afterwards.  For snack calories she is doing 2 white claws and [place] and string cheese for the last 100 calories.  Today she had chicken and vegetable soup and half a sandwich for lunch. Not sure what she is doing for July 4th yet.  Next week she will be in [place] for work; food will be provided for her.  Lunch and dinner will likely be more of an issue than breakfast.   Subjective:   1. Prediabetes Patient is on Metformin daily.  Patient denies GI side effects and no feeling of satiety.  Last A1c 5.8.    2. Vitamin D deficiency Patient is on prescription Vitamin D.  Patient is positive for fatigue.  Last Vitamin D level was 22.  3. Polyphagia Patient has significant hunger throughout the day after every meal.  She is eating 10 oz of meat at night.   Assessment/Plan:   1. Prediabetes Refill and increase- metFORMIN (GLUCOPHAGE) 500 MG tablet; Take 2 tablets (1,000 mg total) by mouth daily with breakfast.  Dispense: 60 tablet; Refill: 0  2. Vitamin D deficiency Refill - Vitamin D, Ergocalciferol, (DRISDOL) 1.25 MG (50000 UNIT) CAPS capsule; Take 1 capsule (50,000 Units total) by mouth every 7 (seven) days.  Dispense: 4 capsule; Refill: 0  3. Polyphagia Discuss Qsymia  at next appointment.   4. BMI 33.0-33.9,adult  5. Obesity with starting BMI of 33.6 Lauren Murray is currently in the action stage of change. As such, her goal is to continue with weight loss efforts. She has agreed to the Category 3 Plan.   Exercise goals: No exercise has been prescribed at this time.  Behavioral modification strategies: increasing lean protein intake, meal planning and cooking strategies, keeping healthy foods in the home, and planning for success.  Lauren Murray has agreed to follow-up with our clinic in 3-4 weeks. She was informed of the importance of frequent follow-up visits to maximize her success with intensive lifestyle modifications for her multiple health conditions.   Objective:   Blood pressure 125/74, pulse 99, temperature 98.4 F (36.9 C), height 5\' 1"  (1.549 m), weight 178 lb (80.7 kg), last menstrual period 10/28/2016, SpO2 97 %. Body mass index is 33.63 kg/m.  General: Cooperative, alert, well developed, in no acute distress. HEENT: Conjunctivae and lids unremarkable. Cardiovascular: Regular rhythm.  Lungs: Normal work of breathing. Neurologic: No focal deficits.   Lab Results  Component Value Date   CREATININE 0.76 01/09/2023   BUN 12 01/09/2023   NA 140 01/09/2023   K 3.9 01/09/2023   CL 99 01/09/2023   CO2 24 01/09/2023   Lab Results  Component Value Date   ALT 28 01/09/2023   AST 20 01/09/2023   ALKPHOS 90 01/09/2023  BILITOT 0.4 01/09/2023   Lab Results  Component Value Date   HGBA1C 5.8 (H) 01/09/2023   Lab Results  Component Value Date   INSULIN 7.3 01/09/2023   Lab Results  Component Value Date   TSH 2.360 01/09/2023   Lab Results  Component Value Date   CHOL 162 01/09/2023   HDL 60 01/09/2023   LDLCALC 84 01/09/2023   TRIG 98 01/09/2023   Lab Results  Component Value Date   VD25OH 22.0 (L) 01/09/2023   Lab Results  Component Value Date   WBC 9.4 01/09/2023   HGB 14.9 01/09/2023   HCT 46.8 (H) 01/09/2023   MCV 90  01/09/2023   PLT 365 01/09/2023   No results found for: "IRON", "TIBC", "FERRITIN"  Attestation Statements:   Reviewed by clinician on day of visit: allergies, medications, problem list, medical history, surgical history, family history, social history, and previous encounter notes.  I, Malcolm Metro, RMA, am acting as transcriptionist for Reuben Likes, MD.  I have reviewed the above documentation for accuracy and completeness, and I agree with the above. - Reuben Likes, MD

## 2023-02-15 ENCOUNTER — Other Ambulatory Visit (INDEPENDENT_AMBULATORY_CARE_PROVIDER_SITE_OTHER): Payer: Self-pay | Admitting: Family Medicine

## 2023-02-15 DIAGNOSIS — R7303 Prediabetes: Secondary | ICD-10-CM

## 2023-03-06 ENCOUNTER — Ambulatory Visit (INDEPENDENT_AMBULATORY_CARE_PROVIDER_SITE_OTHER): Payer: Federal, State, Local not specified - PPO | Admitting: Family Medicine

## 2023-03-14 ENCOUNTER — Other Ambulatory Visit (INDEPENDENT_AMBULATORY_CARE_PROVIDER_SITE_OTHER): Payer: Self-pay | Admitting: Family Medicine

## 2023-03-14 DIAGNOSIS — E559 Vitamin D deficiency, unspecified: Secondary | ICD-10-CM

## 2023-03-21 ENCOUNTER — Ambulatory Visit (INDEPENDENT_AMBULATORY_CARE_PROVIDER_SITE_OTHER): Payer: Federal, State, Local not specified - PPO | Admitting: Family Medicine

## 2023-03-25 ENCOUNTER — Ambulatory Visit (INDEPENDENT_AMBULATORY_CARE_PROVIDER_SITE_OTHER): Payer: Federal, State, Local not specified - PPO | Admitting: Family Medicine

## 2023-03-25 ENCOUNTER — Encounter (INDEPENDENT_AMBULATORY_CARE_PROVIDER_SITE_OTHER): Payer: Self-pay | Admitting: Family Medicine

## 2023-03-25 VITALS — BP 117/74 | HR 91 | Temp 98.2°F | Ht 61.0 in | Wt 178.0 lb

## 2023-03-25 DIAGNOSIS — E559 Vitamin D deficiency, unspecified: Secondary | ICD-10-CM

## 2023-03-25 DIAGNOSIS — R7303 Prediabetes: Secondary | ICD-10-CM | POA: Diagnosis not present

## 2023-03-25 DIAGNOSIS — E669 Obesity, unspecified: Secondary | ICD-10-CM

## 2023-03-25 DIAGNOSIS — Z6833 Body mass index (BMI) 33.0-33.9, adult: Secondary | ICD-10-CM

## 2023-03-25 MED ORDER — VITAMIN D (ERGOCALCIFEROL) 1.25 MG (50000 UNIT) PO CAPS: 50000.0000 [IU] | ORAL_CAPSULE | ORAL | 0 refills | Status: DC

## 2023-03-25 MED ORDER — METFORMIN HCL 500 MG PO TABS: 1000.0000 mg | ORAL_TABLET | Freq: Every day | ORAL | 0 refills | Status: DC

## 2023-03-25 NOTE — Progress Notes (Signed)
Chief Complaint:   OBESITY Lauren Murray is here to discuss her progress with her obesity treatment plan along with follow-up of her obesity related diagnoses. Lauren Murray is on the Category 3 Plan and states she is following her eating plan approximately 25% of the time. Lauren Murray states she is doing 0 minutes 0 times per week.  Today's visit was #: 4 Starting weight: 178 lbs Starting date: 01/09/2023 Today's weight: 178 lbs Today's date: 03/25/2023 Total lbs lost to date: 0 Total lbs lost since last in-office visit: 0  Interim History: Patient has been busy over the last few week.  She went to [place] and then she had another trip last week.  She has more travel over the next few weeks. She hasn't been incorporating protein bars or shakes because she has been trying to focus on eating actual protein.  She hasn't been doing much activity due to more time on the road. She feels like after work all she wants to do is cool off and lay down. She isn't having an issue maintaining it is the weight loss she is having an issue with.   Subjective:   1. Prediabetes Patient is on metformin twice daily.  She is traveling quite a bit for work recently so she will have high carbohydrate intake (no cravings though).  2. Vitamin D deficiency Patient is on prescription vitamin D.  She denies nausea, vomiting, or muscle weakness but notes fatigue.  Assessment/Plan:   1. Prediabetes Patient will continue metformin 1000 mg daily, and we will refill for 1 month.  - metFORMIN (GLUCOPHAGE) 500 MG tablet; Take 2 tablets (1,000 mg total) by mouth daily with breakfast.  Dispense: 60 tablet; Refill: 0  2. Vitamin D deficiency Patient will continue prescription vitamin D once weekly, and we will refill for 1 month.  - Vitamin D, Ergocalciferol, (DRISDOL) 1.25 MG (50000 UNIT) CAPS capsule; Take 1 capsule (50,000 Units total) by mouth every 7 (seven) days.  Dispense: 4 capsule; Refill: 0  3. BMI 33.0-33.9,adult  4.  Obesity with starting BMI of 33.6 Lauren Murray is currently in the action stage of change. As such, her goal is to continue with weight loss efforts. She has agreed to the Category 3 Plan and keeping a food journal and adhering to recommended goals of 350-450 calories and 30+ grams of protein at lunch, and 450-550 calories and 40+ grams of protein at supper.   Exercise goals: No exercise has been prescribed at this time.  Behavioral modification strategies: increasing lean protein intake, meal planning and cooking strategies, keeping healthy foods in the home, and planning for success.  Lauren Murray has agreed to follow-up with our clinic in 3 to 4 weeks. She was informed of the importance of frequent follow-up visits to maximize her success with intensive lifestyle modifications for her multiple health conditions.   Objective:   Blood pressure 117/74, pulse 91, temperature 98.2 F (36.8 C), height 5\' 1"  (1.549 m), weight 178 lb (80.7 kg), last menstrual period 10/28/2016, SpO2 98%. Body mass index is 33.63 kg/m.  General: Cooperative, alert, well developed, in no acute distress. HEENT: Conjunctivae and lids unremarkable. Cardiovascular: Regular rhythm.  Lungs: Normal work of breathing. Neurologic: No focal deficits.   Lab Results  Component Value Date   CREATININE 0.76 01/09/2023   BUN 12 01/09/2023   NA 140 01/09/2023   K 3.9 01/09/2023   CL 99 01/09/2023   CO2 24 01/09/2023   Lab Results  Component Value Date   ALT  28 01/09/2023   AST 20 01/09/2023   ALKPHOS 90 01/09/2023   BILITOT 0.4 01/09/2023   Lab Results  Component Value Date   HGBA1C 5.8 (H) 01/09/2023   Lab Results  Component Value Date   INSULIN 7.3 01/09/2023   Lab Results  Component Value Date   TSH 2.360 01/09/2023   Lab Results  Component Value Date   CHOL 162 01/09/2023   HDL 60 01/09/2023   LDLCALC 84 01/09/2023   TRIG 98 01/09/2023   Lab Results  Component Value Date   VD25OH 22.0 (L) 01/09/2023   Lab  Results  Component Value Date   WBC 9.4 01/09/2023   HGB 14.9 01/09/2023   HCT 46.8 (H) 01/09/2023   MCV 90 01/09/2023   PLT 365 01/09/2023   No results found for: "IRON", "TIBC", "FERRITIN"  Attestation Statements:   Reviewed by clinician on day of visit: allergies, medications, problem list, medical history, surgical history, family history, social history, and previous encounter notes.   I, Burt Knack, am acting as transcriptionist for Reuben Likes, MD.  I have reviewed the above documentation for accuracy and completeness, and I agree with the above. - Reuben Likes, MD

## 2023-04-15 ENCOUNTER — Telehealth (INDEPENDENT_AMBULATORY_CARE_PROVIDER_SITE_OTHER): Payer: Federal, State, Local not specified - PPO | Admitting: Family Medicine

## 2023-04-15 ENCOUNTER — Encounter (INDEPENDENT_AMBULATORY_CARE_PROVIDER_SITE_OTHER): Payer: Self-pay | Admitting: Family Medicine

## 2023-04-15 ENCOUNTER — Telehealth: Payer: Self-pay

## 2023-04-15 DIAGNOSIS — E66811 Obesity, class 1: Secondary | ICD-10-CM

## 2023-04-15 DIAGNOSIS — Z6833 Body mass index (BMI) 33.0-33.9, adult: Secondary | ICD-10-CM | POA: Diagnosis not present

## 2023-04-15 DIAGNOSIS — E559 Vitamin D deficiency, unspecified: Secondary | ICD-10-CM

## 2023-04-15 DIAGNOSIS — E669 Obesity, unspecified: Secondary | ICD-10-CM

## 2023-04-15 DIAGNOSIS — R7303 Prediabetes: Secondary | ICD-10-CM

## 2023-04-15 MED ORDER — ZEPBOUND 2.5 MG/0.5ML ~~LOC~~ SOAJ: 2.5000 mg | SUBCUTANEOUS | 0 refills | Status: AC

## 2023-04-15 MED ORDER — METFORMIN HCL 500 MG PO TABS: 1000.0000 mg | ORAL_TABLET | Freq: Every day | ORAL | 0 refills | Status: AC

## 2023-04-15 MED ORDER — VITAMIN D (ERGOCALCIFEROL) 1.25 MG (50000 UNIT) PO CAPS: 50000.0000 [IU] | ORAL_CAPSULE | ORAL | 0 refills | Status: AC

## 2023-04-15 NOTE — Telephone Encounter (Signed)
Per Cover My Meds: The authorization is valid from 03/16/2023 through 10/12/2023.

## 2023-04-15 NOTE — Telephone Encounter (Signed)
PA submitted through Cover My Meds for Zepbound. Awaiting insurance determination. Key: [id number]

## 2023-04-15 NOTE — Progress Notes (Signed)
TeleHealth Visit:  This visit was completed with telemedicine (audio/video) technology. Lauren Murray has verbally consented to this TeleHealth visit. The patient is located at home, the provider is located at home. The participants in this visit include the listed provider and patient. The visit was conducted today via MyChart video.  OBESITY Lauren Murray is here to discuss her progress with her obesity treatment plan along with follow-up of her obesity related diagnoses.   Today's visit was # 5 Starting weight: 178 lbs Starting date: 01/09/23 Weight at last in office visit: 178 lbs on 03/25/23 Total weight loss: 0 lbs at last in office visit on 03/25/23. Today's reported weight (04/15/23): none reported  Nutrition Plan: the Category 3 plan, journaling lunch with goal of 350-450 calories and 30 grams of protein, and journaling dinner with goal of 450-550 calories and 40 grams of protein   Current exercise:  none  Interim History:  She travels for work about 95% of time. She is able to to stay on plan at breakfast. Sometimes this requires eating out with the team for both dinner and lunch. She has a fridge in her hotel room. She finds it hard to make healthy choices when she eats out.  She has our dining out guide. She is taking a cooler with her during the day and packing healthy foods and protein snacks. She drinks mostly water and denies intake of sugar sweetened beverages.  She is interested in starting antiobesity medication.  Assessment/Plan:  1. Prediabetes Last A1c was 5.8.  Medication(s): Metformin 1000 mg in a.m. with breakfast. Polyphagia:Yes Lab Results  Component Value Date   HGBA1C 5.8 (H) 01/09/2023   Lab Results  Component Value Date   INSULIN 7.3 01/09/2023    Plan: Continue and refill metformin 500 mg-take 1000 mg in a.m. with breakfast.   2. Vitamin D Deficiency Vitamin D is not at goal of 50.  Most recent vitamin D level was 22. She is on  prescription  ergocalciferol 50,000 IU weekly. Lab Results  Component Value Date   VD25OH 22.0 (L) 01/09/2023    Plan: Continue and refill  prescription ergocalciferol 50,000 IU weekly  3. Generalized Obesity: Current BMI 33  Pharmacotherapy Plan Start  Zepbound 2.5 mg SQ weekly  Lauren Murray denies personal or family history of thyroid cancer, history of pancreatitis, or current cholelithiasis. Lauren Murray was informed of the most common side effects (nausea, constipation, diarrhea).  Lauren Murray is currently in the action stage of change. As such, her goal is to continue with weight loss efforts.  She has agreed to practicing portion control and making smarter food choices, such as increasing vegetables and decreasing simple carbohydrates.  1.  Have protein at every meal. 2.  If having a carbohydrates such as rice or potatoes at a meal, limit serving to 1/2 cup. 3.  Focus on maximizing protein and vegetable intake and minimizing carbohydrate intake. 4.  Suggested that she get a sandwich size food container and pack a sandwich to take with her for lunch.  Exercise goals: No exercise has been prescribed at this time.  Behavioral modification strategies: increasing lean protein intake, decreasing simple carbohydrates , meal planning , and planning for success.  Lauren Murray has agreed to follow-up with our clinic in 5 weeks.  No orders of the defined types were placed in this encounter.   Medications Discontinued During This Encounter  Medication Reason   metFORMIN (GLUCOPHAGE) 500 MG tablet Reorder   Vitamin D, Ergocalciferol, (DRISDOL) 1.25 MG (50000 UNIT) CAPS capsule Reorder  Meds ordered this encounter  Medications   tirzepatide (ZEPBOUND) 2.5 MG/0.5ML Pen    Sig: Inject 2.5 mg into the skin once a week.    Dispense:  2 mL    Refill:  0    Order Specific Question:   Supervising Provider    Answer:   Carolin Sicks   Vitamin D, Ergocalciferol, (DRISDOL) 1.25 MG (50000 UNIT) CAPS capsule    Sig:  Take 1 capsule (50,000 Units total) by mouth every 7 (seven) days.    Dispense:  4 capsule    Refill:  0    Order Specific Question:   Supervising Provider    Answer:   Carolin Sicks   metFORMIN (GLUCOPHAGE) 500 MG tablet    Sig: Take 2 tablets (1,000 mg total) by mouth daily with breakfast.    Dispense:  60 tablet    Refill:  0    Order Specific Question:   Supervising Provider    Answer:   Glennis Brink [[id number]]      Objective:   VITALS: Per patient if applicable, see vitals. GENERAL: Alert and in no acute distress. CARDIOPULMONARY: No increased WOB. Speaking in clear sentences.  PSYCH: Pleasant and cooperative. Speech normal rate and rhythm. Affect is appropriate. Insight and judgement are appropriate. Attention is focused, linear, and appropriate.  NEURO: Oriented as arrived to appointment on time with no prompting.   Attestation Statements:   Reviewed by clinician on day of visit: allergies, medications, problem list, medical history, surgical history, family history, social history, and previous encounter notes.   This was prepared with the assistance of Engineer, civil (consulting).  Occasional wrong-word or sound-a-like substitutions may have occurred due to the inherent limitations of voice recognition software.

## 2023-05-20 ENCOUNTER — Ambulatory Visit (INDEPENDENT_AMBULATORY_CARE_PROVIDER_SITE_OTHER): Payer: Federal, State, Local not specified - PPO | Admitting: Family Medicine

## 2023-08-09 DIAGNOSIS — N959 Unspecified menopausal and perimenopausal disorder: Secondary | ICD-10-CM | POA: Diagnosis not present

## 2023-08-09 DIAGNOSIS — I10 Essential (primary) hypertension: Secondary | ICD-10-CM | POA: Diagnosis not present

## 2023-08-09 DIAGNOSIS — E7800 Pure hypercholesterolemia, unspecified: Secondary | ICD-10-CM | POA: Diagnosis not present

## 2023-08-09 DIAGNOSIS — E559 Vitamin D deficiency, unspecified: Secondary | ICD-10-CM | POA: Diagnosis not present

## 2023-08-09 DIAGNOSIS — Z0000 Encounter for general adult medical examination without abnormal findings: Secondary | ICD-10-CM | POA: Diagnosis not present

## 2023-08-09 DIAGNOSIS — R7303 Prediabetes: Secondary | ICD-10-CM | POA: Diagnosis not present

## 2023-09-03 ENCOUNTER — Other Ambulatory Visit: Payer: Self-pay | Admitting: Internal Medicine

## 2023-09-03 DIAGNOSIS — Z1231 Encounter for screening mammogram for malignant neoplasm of breast: Secondary | ICD-10-CM

## 2024-01-14 ENCOUNTER — Ambulatory Visit
Admission: RE | Admit: 2024-01-14 | Discharge: 2024-01-14 | Disposition: A | Discharge status: Home / Self Care | Source: Ambulatory Visit | Attending: Internal Medicine | Admitting: Internal Medicine

## 2024-01-14 DIAGNOSIS — Z1231 Encounter for screening mammogram for malignant neoplasm of breast: Secondary | ICD-10-CM | POA: Diagnosis not present

## 2024-01-14 DIAGNOSIS — M25522 Pain in left elbow: Secondary | ICD-10-CM | POA: Diagnosis not present

## 2024-01-23 DIAGNOSIS — Z09 Encounter for follow-up examination after completed treatment for conditions other than malignant neoplasm: Secondary | ICD-10-CM | POA: Diagnosis not present

## 2024-01-23 DIAGNOSIS — Z860100 Personal history of colon polyps, unspecified: Secondary | ICD-10-CM | POA: Diagnosis not present

## 2024-01-23 DIAGNOSIS — K5730 Diverticulosis of large intestine without perforation or abscess without bleeding: Secondary | ICD-10-CM | POA: Diagnosis not present

## 2024-02-07 DIAGNOSIS — E7800 Pure hypercholesterolemia, unspecified: Secondary | ICD-10-CM | POA: Diagnosis not present

## 2024-02-07 DIAGNOSIS — F39 Unspecified mood [affective] disorder: Secondary | ICD-10-CM | POA: Diagnosis not present

## 2024-02-07 DIAGNOSIS — E669 Obesity, unspecified: Secondary | ICD-10-CM | POA: Diagnosis not present

## 2024-02-07 DIAGNOSIS — I10 Essential (primary) hypertension: Secondary | ICD-10-CM | POA: Diagnosis not present

## 2024-08-10 DIAGNOSIS — D369 Benign neoplasm, unspecified site: Secondary | ICD-10-CM | POA: Diagnosis not present

## 2024-08-10 DIAGNOSIS — E559 Vitamin D deficiency, unspecified: Secondary | ICD-10-CM | POA: Diagnosis not present

## 2024-08-10 DIAGNOSIS — Z0000 Encounter for general adult medical examination without abnormal findings: Secondary | ICD-10-CM | POA: Diagnosis not present

## 2024-08-10 DIAGNOSIS — F39 Unspecified mood [affective] disorder: Secondary | ICD-10-CM | POA: Diagnosis not present

## 2024-08-10 DIAGNOSIS — R7303 Prediabetes: Secondary | ICD-10-CM | POA: Diagnosis not present

## 2024-08-10 DIAGNOSIS — M7712 Lateral epicondylitis, left elbow: Secondary | ICD-10-CM | POA: Diagnosis not present

## 2024-08-10 DIAGNOSIS — I10 Essential (primary) hypertension: Secondary | ICD-10-CM | POA: Diagnosis not present

## 2024-08-10 DIAGNOSIS — E7800 Pure hypercholesterolemia, unspecified: Secondary | ICD-10-CM | POA: Diagnosis not present
